# Patient Record
Sex: Female | Born: 1960 | Race: Black or African American | Hispanic: No | Marital: Single
Health system: Midwestern US, Community
[De-identification: ages and names within clinical notes are randomized; demographics above are authoritative.]

## PROBLEM LIST (undated history)

## (undated) ENCOUNTER — Emergency Department (HOSPITAL_COMMUNITY): Admission: EM | Payer: Self-pay | Source: Home / Self Care

## (undated) DIAGNOSIS — J45909 Unspecified asthma, uncomplicated: Secondary | ICD-10-CM

## (undated) DIAGNOSIS — Z915 Personal history of self-harm: Secondary | ICD-10-CM

## (undated) DIAGNOSIS — Z8673 Personal history of transient ischemic attack (TIA), and cerebral infarction without residual deficits: Secondary | ICD-10-CM

## (undated) DIAGNOSIS — I10 Essential (primary) hypertension: Secondary | ICD-10-CM

## (undated) DIAGNOSIS — Z9151 Personal history of suicidal behavior: Secondary | ICD-10-CM

## (undated) DIAGNOSIS — G8929 Other chronic pain: Secondary | ICD-10-CM

## (undated) DIAGNOSIS — I519 Heart disease, unspecified: Secondary | ICD-10-CM

## (undated) DIAGNOSIS — M25572 Pain in left ankle and joints of left foot: Secondary | ICD-10-CM

## (undated) DIAGNOSIS — E785 Hyperlipidemia, unspecified: Secondary | ICD-10-CM

## (undated) DIAGNOSIS — R011 Cardiac murmur, unspecified: Secondary | ICD-10-CM

## (undated) DIAGNOSIS — E119 Type 2 diabetes mellitus without complications: Secondary | ICD-10-CM

## (undated) DIAGNOSIS — J439 Emphysema, unspecified: Secondary | ICD-10-CM

## (undated) DIAGNOSIS — F17200 Nicotine dependence, unspecified, uncomplicated: Secondary | ICD-10-CM

## (undated) DIAGNOSIS — G4700 Insomnia, unspecified: Secondary | ICD-10-CM

## (undated) DIAGNOSIS — F419 Anxiety disorder, unspecified: Secondary | ICD-10-CM

## (undated) DIAGNOSIS — K219 Gastro-esophageal reflux disease without esophagitis: Secondary | ICD-10-CM

## (undated) DIAGNOSIS — IMO0001 Reserved for inherently not codable concepts without codable children: Secondary | ICD-10-CM

## (undated) DIAGNOSIS — E559 Vitamin D deficiency, unspecified: Secondary | ICD-10-CM

## (undated) DIAGNOSIS — B3731 Acute candidiasis of vulva and vagina: Secondary | ICD-10-CM

## (undated) DIAGNOSIS — D4989 Neoplasm of unspecified behavior of other specified sites: Secondary | ICD-10-CM

## (undated) HISTORY — DX: Personal history of transient ischemic attack (TIA), and cerebral infarction without residual deficits: Z86.73

## (undated) HISTORY — DX: Anxiety disorder, unspecified: F41.9

## (undated) HISTORY — DX: Nicotine dependence, unspecified, uncomplicated: F17.200

## (undated) HISTORY — DX: Other chronic pain: G89.29

## (undated) HISTORY — DX: Cardiac murmur, unspecified: R01.1

## (undated) HISTORY — DX: Hyperlipidemia, unspecified: E78.5

## (undated) HISTORY — DX: Pain in left ankle and joints of left foot: M25.572

## (undated) HISTORY — DX: Personal history of self-harm: Z91.5

## (undated) HISTORY — DX: Insomnia, unspecified: G47.00

## (undated) HISTORY — DX: Personal history of suicidal behavior: Z91.51

## (undated) HISTORY — DX: Heart disease, unspecified: I51.9

---

## 2015-01-24 ENCOUNTER — Telehealth: Payer: Self-pay | Admitting: Medical

## 2015-01-24 NOTE — Telephone Encounter (Signed)
Records received from previous providers. Sending back to chandra and then to [facility] for review.

## 2015-01-27 NOTE — Telephone Encounter (Signed)
Records was sorted and filled

## 2015-01-29 ENCOUNTER — Institutional Professional Consult (permissible substitution): Payer: Self-pay | Admitting: Medical

## 2015-01-31 ENCOUNTER — Encounter (HOSPITAL_COMMUNITY): Payer: Self-pay | Admitting: *Deleted

## 2015-01-31 ENCOUNTER — Emergency Department (HOSPITAL_COMMUNITY): Payer: 59

## 2015-01-31 ENCOUNTER — Telehealth (HOSPITAL_BASED_OUTPATIENT_CLINIC_OR_DEPARTMENT_OTHER): Payer: Self-pay | Admitting: Emergency Medicine

## 2015-01-31 ENCOUNTER — Emergency Department (HOSPITAL_COMMUNITY)
Admission: EM | Admit: 2015-01-31 | Discharge: 2015-01-31 | Disposition: A | Discharge status: Home / Self Care | Payer: 59 | Attending: Emergency Medicine | Admitting: Emergency Medicine

## 2015-01-31 DIAGNOSIS — I10 Essential (primary) hypertension: Secondary | ICD-10-CM | POA: Insufficient documentation

## 2015-01-31 DIAGNOSIS — Z79899 Other long term (current) drug therapy: Secondary | ICD-10-CM | POA: Insufficient documentation

## 2015-01-31 DIAGNOSIS — E119 Type 2 diabetes mellitus without complications: Secondary | ICD-10-CM | POA: Insufficient documentation

## 2015-01-31 DIAGNOSIS — L299 Pruritus, unspecified: Secondary | ICD-10-CM | POA: Diagnosis not present

## 2015-01-31 DIAGNOSIS — R51 Headache: Secondary | ICD-10-CM | POA: Diagnosis not present

## 2015-01-31 DIAGNOSIS — J441 Chronic obstructive pulmonary disease with (acute) exacerbation: Secondary | ICD-10-CM | POA: Diagnosis not present

## 2015-01-31 DIAGNOSIS — Z720 Tobacco use: Secondary | ICD-10-CM | POA: Insufficient documentation

## 2015-01-31 DIAGNOSIS — J040 Acute laryngitis: Secondary | ICD-10-CM | POA: Insufficient documentation

## 2015-01-31 DIAGNOSIS — R05 Cough: Secondary | ICD-10-CM | POA: Diagnosis present

## 2015-01-31 HISTORY — DX: Emphysema, unspecified: J43.9

## 2015-01-31 HISTORY — DX: Type 2 diabetes mellitus without complications: E11.9

## 2015-01-31 HISTORY — DX: Unspecified asthma, uncomplicated: J45.909

## 2015-01-31 HISTORY — DX: Essential (primary) hypertension: I10

## 2015-01-31 LAB — CBC
HCT: 45.2 % (ref 36.0–46.0)
Hemoglobin: 14.8 g/dL (ref 12.0–15.0)
MCH: 29.8 pg (ref 26.0–34.0)
MCHC: 32.7 g/dL (ref 30.0–36.0)
MCV: 91.1 fL (ref 78.0–100.0)
Platelets: 271 10*3/uL (ref 150–400)
RBC: 4.96 MIL/uL (ref 3.87–5.11)
RDW: 14.4 % (ref 11.5–15.5)
WBC: 4.2 10*3/uL (ref 4.0–10.5)

## 2015-01-31 LAB — BASIC METABOLIC PANEL
ANION GAP: 10 (ref 5–15)
BUN: 12 mg/dL (ref 6–20)
CHLORIDE: 103 mmol/L (ref 101–111)
CO2: 26 mmol/L (ref 22–32)
Calcium: 9 mg/dL (ref 8.9–10.3)
Creatinine, Ser: 0.8 mg/dL (ref 0.44–1.00)
GFR calc non Af Amer: >60 mL/min (ref >60)
Glucose, Bld: 187 mg/dL — ABNORMAL HIGH (ref 65–99)
POTASSIUM: 3.9 mmol/L (ref 3.5–5.1)
Sodium: 139 mmol/L (ref 135–145)

## 2015-01-31 LAB — I-STAT TROPONIN, ED: TROPONIN I, POC: 0.01 ng/mL (ref 0.00–0.08)

## 2015-01-31 LAB — CBG MONITORING, ED: GLUCOSE-CAPILLARY: 201 mg/dL — AB (ref 65–99)

## 2015-01-31 MED ORDER — HYDROXYZINE HCL 25 MG PO TABS
25.0000 mg | ORAL_TABLET | Freq: Four times a day (QID) | ORAL | Status: DC
Start: 2015-01-31 — End: 2015-02-20

## 2015-01-31 MED ORDER — PREDNISONE 20 MG PO TABS: ORAL_TABLET | ORAL | Status: DC

## 2015-01-31 NOTE — ED Notes (Signed)
Pt called out, when this RN went to check on pt, pt started yelling and cursing, stating "I've been waiting here for 3 hours and y'all haven't given me anything to stop this itching, you gonna watch me scratch myself to death, I can't breathe and y'all are letting me croak to death. Why are you looking at me,  Bring me something to stop this". Explained to pt that until EDP evaluates her and orders are placed, this RN can't do anything for her itching. Cool compresses offered pt refused. Pt's VSS and xray is unremarkable, pt was observed talking and laughing loudly entire time while in the room, prior to raging at this Clinical research associate.

## 2015-01-31 NOTE — ED Provider Notes (Signed)
CSN: [id number]     Arrival date & time 01/31/15  1209 History   First MD Initiated Contact with Patient 01/31/15 1420     Chief Complaint  Patient presents with  . Asthma  . Cough  . Headache     (Consider location/radiation/quality/duration/timing/severity/associated sxs/prior Treatment) HPI Patient states that she has had a cough for about a week. She is having some white mucus production occasionally. She was she coughs really hard at [facility] burned up in the center of her chest and her throat. She has not had any fever. Associated pleuritic quality chest pain. She reports also her voice has been hoarse for the past week. The patient does continue to smoke and has a diagnosis of COPD. She also states that she has itching that has been going on for a number of years. Her doctor in the past of given her hydroxyzine which is helpful but she has run out of it now due to moving. She reports that she occasionally sees a little bump but mostly sure there is nothing there and she just scratches until her skin gets inflamed and may be even scratch is a little mark on it. He like me to check and make sure she doesn't have any lice. She states that she does not go outside and doesn't think she could've had any insect bites or allergic reaction to any outdoor components. She does however note that the rash seems to get worse and she itches more when she's been on the sun. Past Medical History  Diagnosis Date  . Emphysema/COPD   . Asthma   . Hypertension   . Diabetes mellitus without complication    History reviewed. No pertinent past surgical history. History reviewed. No pertinent family history. History  Substance Use Topics  . Smoking status: Current Every Day Smoker -- 1.00 packs/day for 40 years    Types: Cigarettes  . Smokeless tobacco: Not on file  . Alcohol Use: No   OB History    No data available     Review of Systems 10 Systems reviewed and are negative for acute change except as  noted in the HPI.    Allergies  Review of patient's allergies indicates no known allergies.  Home Medications   Prior to Admission medications   Medication Sig Start Date End Date Taking? Authorizing Provider  ALPRAZolam (XANAX XR) 2 MG 24 hr tablet Take 2 mg by mouth at bedtime as needed (for sleep).   Yes Historical Provider, MD  cloNIDine (CATAPRES) 0.2 MG tablet Take 0.2 mg by mouth 2 (two) times daily.   Yes Historical Provider, MD  HYDROcodone-acetaminophen (NORCO) 7.5-325 MG per tablet Take 1 tablet by mouth every 4 (four) hours as needed for moderate pain.   Yes Historical Provider, MD  hydrOXYzine (ATARAX/VISTARIL) 25 MG tablet Take 25 mg by mouth every 6 (six) hours as needed for itching.   Yes Historical Provider, MD  lisinopril (PRINIVIL,ZESTRIL) 20 MG tablet Take 20 mg by mouth daily.   Yes Historical Provider, MD  lisinopril-hydrochlorothiazide (PRINZIDE,ZESTORETIC) 10-12.5 MG per tablet Take 1 tablet by mouth daily.   Yes Historical Provider, MD  metFORMIN (GLUCOPHAGE) 850 MG tablet Take 850 mg by mouth 2 (two) times daily with a meal.   Yes Historical Provider, MD  hydrOXYzine (ATARAX/VISTARIL) 25 MG tablet Take 1 tablet (25 mg total) by mouth every 6 (six) hours. 01/31/15   Arby Barrette, MD  predniSONE (DELTASONE) 20 MG tablet 3 tabs po day one, then 2 tabs  daily x 4 days 01/31/15   Arby Barrette, MD   BP 178/111 mmHg  Pulse 83  Temp(Src) 97.9 F (36.6 C) (Oral)  Resp 18  SpO2 96% Physical Exam  Constitutional: She is oriented to person, place, and time. She appears well-developed and well-nourished.  HENT:  Head: Normocephalic and atraumatic.  Patient's voice is slightly hoarse. Posterior oropharynx is widely patent with no erythema or exudate. Uvula is midline. Neck is supple and there is no stridor.  Eyes: EOM are normal. Pupils are equal, round, and reactive to light.  Neck: Neck supple.  Cardiovascular: Normal rate, regular rhythm, normal heart sounds and  intact distal pulses.   Pulmonary/Chest: Effort normal. She has wheezes.  Occasional fine expiratory wheeze. Good air flow with no respiratory distress.  Abdominal: Soft. Bowel sounds are normal. She exhibits no distension. There is no tenderness.  Musculoskeletal: Normal range of motion. She exhibits no edema.  Neurological: She is alert and oriented to person, place, and time. She has normal strength. Coordination normal. GCS eye subscore is 4. GCS verbal subscore is 5. GCS motor subscore is 6.  Skin: Skin is warm, dry and intact.  Skin condition is excellent. Close examination of the hair around the ears and the nape shows it to be clean with no evidence of nits or dermatitis of any type. Remainder of her skin has occasional area that she has scratched and excoriated very mildly. Her general skin condition however is very good with no excessive dryness, scaling or irregular texture.  Psychiatric: She has a normal mood and affect.    ED Course  Procedures (including critical care time) Labs Review Labs Reviewed  BASIC METABOLIC PANEL - Abnormal; Notable for the following:    Glucose, Bld 187 (*)    All other components within normal limits  CBG MONITORING, ED - Abnormal; Notable for the following:    Glucose-Capillary 201 (*)    All other components within normal limits  CBC  I-STAT TROPOININ, ED    Imaging Review Dg Chest 2 View (if Patient Has Fever And/or Copd)  01/31/2015   CLINICAL DATA:  Cough for 8 days.  Smoker.  Emphysema.  EXAM: CHEST  2 VIEW  COMPARISON:  None.  FINDINGS: Low lung volumes. The heart size and mediastinal contours are within normal limits. Both lungs are clear. The visualized skeletal structures are unremarkable.  IMPRESSION: No active cardiopulmonary disease.   Electronically Signed   By: Elsie Stain M.D.   On: 01/31/2015 13:22     EKG Interpretation None      MDM   Final diagnoses:  COPD exacerbation  Laryngitis  Pruritus   Patient identifies  himself as being between physicians due to having moved recently. She appears to have a mild acute exacerbation of her COPD. There are mild findings of laryngitis. The pruritus is chronic with unknown etiology. She identifies that symptom ever since a blood transfusion over 10 years ago. She reports she was told that she had gotten transfused with some "antibodies" and now she thinks this is the cause.    Arby Barrette, MD 01/31/15 [phone]

## 2015-01-31 NOTE — ED Notes (Addendum)
Pt reports hx of COPD/asthma. 40 year cigarette smoker. Reports productive cough x2 weeks. Reports throat pain and headache 9/10. Slight expiratory wheezing heard. Using inhaler with no relief.   Pt reports she has had 3 blood transfusions in past and last transfusion caused her to have rash of some type.  Pt originally from [place], just got a primary doctor, first appointment July 7. Pt requesting prescription for Brio medication.

## 2015-01-31 NOTE — Discharge Instructions (Signed)
Chronic Asthmatic Bronchitis Chronic asthmatic bronchitis is a complication of persistent asthma. After a period of time with asthma, some people develop airflow obstruction that is present all the time, even when not having an asthma attack.There is also persistent inflammation of the airways, and the bronchial tubes produce more mucus. Chronic asthmatic bronchitis usually is a permanent problem with the lungs. CAUSES  Chronic asthmatic bronchitis happens most often in people who have asthma and also smoke cigarettes. Occasionally, it can happen to a person with long-standing or severe asthma even if the person is not a smoker. SIGNS AND SYMPTOMS  Chronic asthmatic bronchitis usually causes symptoms of both asthma and chronic bronchitis, including:   Coughing.  Increased sputum production.  Wheezing and shortness of breath.  Chest discomfort.  Recurring infections. DIAGNOSIS  Your health care provider will take a medical history and perform a physical exam. Chronic asthmatic bronchitis is suspected when a person with asthma has abnormal results on breathing tests (pulmonary function tests) even when breathing symptoms are at their best. Other tests, such as a chest X-ray, may be performed to rule out other conditions.  TREATMENT  Treatment involves controlling symptoms with medicine and lifestyle changes.  Your health care provider may prescribe asthma medicines, including inhaler and nebulizer medicines.  Infection can be treated with medicine to kill germs (antibiotics). Serious infections may require hospitalization. These can include:  Pneumonia.  Sinus infections.  Acute bronchitis.   Preventing infection and hospitalization is very important. Get an influenza vaccination every year as directed by your health care provider. Ask your health care provider whether you need a pneumonia vaccine.  Ask your health care provider whether you would benefit from a pulmonary  rehabilitation program. HOME CARE INSTRUCTIONS  Take medicines only as directed by your health care provider.  If you are a cigarette smoker, the most important thing that you can do is quit. Talk to your health care provider for help with quitting smoking.  Avoid pollen, dust, animal dander, molds, smoke, and other things that cause attacks.  Regular exercise is very important to help you feel better. Discuss possible exercise routines with your health care provider.  If animal dander is the cause of asthma, you may not be able to keep pets.  It is important that you:  Become educated about your medical condition.  Participate in maintaining wellness.  Seek medical care as directed. Delay in seeking medical care could cause permanent injury and may be a risk to your life. SEEK MEDICAL CARE IF:  You have wheezing and shortness of breath even if taking medicine to prevent attacks.  You have muscle aches, chest pain, or thickening of sputum.  Your sputum changes from clear or white to yellow, green, gray, or bloody. SEEK IMMEDIATE MEDICAL CARE IF:  Your usual medicines do not stop your wheezing.  You have increased coughing or shortness of breath or both.  You have increased difficulty breathing.  You have any problems from the medicine you are taking, such as a rash, itching, swelling, or trouble breathing. MAKE SURE YOU:   Understand these instructions.  Will watch your condition.  Will get help right away if you are not doing well or get worse. Document Released: 05/20/2006 Document Revised: 12/17/2013 Document Reviewed: 09/10/2013 [facility] Patient Information 2015 [place], [place]. This information is not intended to replace advice given to you by your health care provider. Make sure you discuss any questions you have with your health care provider. Laryngitis At the top  of your windpipe is your voice box. It is the source of your voice. Inside your voice box are 2  bands of muscles called vocal cords. When you breathe, your vocal cords are relaxed and open so that air can get into the lungs. When you decide to say something, these cords come together and vibrate. The sound from these vibrations goes into your throat and comes out through your mouth as sound. Laryngitis is an inflammation of the vocal cords that causes hoarseness, cough, loss of voice, sore throat, and dry throat. Laryngitis can be temporary (acute) or long-term (chronic). Most cases of acute laryngitis improve with time.Chronic laryngitis lasts for more than 3 weeks. CAUSES Laryngitis can often be related to excessive smoking, talking, or yelling, as well as inhalation of toxic fumes and allergies. Acute laryngitis is usually caused by a viral infection, vocal strain, measles or mumps, or bacterial infections. Chronic laryngitis is usually caused by vocal cord strain, vocal cord injury, postnasal drip, growths on the vocal cords, or acid reflux. SYMPTOMS   Cough.  Sore throat.  Dry throat. RISK FACTORS  Respiratory infections.  Exposure to irritating substances, such as cigarette smoke, excessive amounts of alcohol, stomach acids, and workplace chemicals.  Voice trauma, such as vocal cord injury from shouting or speaking too loud. DIAGNOSIS  Your cargiver will perform a physical exam. During the physical exam, your caregiver will examine your throat. The most common sign of laryngitis is hoarseness. Laryngoscopy may be necessary to confirm the diagnosis of this condition. This procedure allows your caregiver to look into the larynx. HOME CARE INSTRUCTIONS  Drink enough fluids to keep your urine clear or pale yellow.  Rest until you no longer have symptoms or as directed by your caregiver.  Breathe in moist air.  Take all medicine as directed by your caregiver.  Do not smoke.  Talk as little as possible (this includes whispering).  Write on paper instead of talking until your  voice is back to normal.  Follow up with your caregiver if your condition has not improved after 10 days. SEEK MEDICAL CARE IF:   You have trouble breathing.  You cough up blood.  You have persistent fever.  You have increasing pain.  You have difficulty swallowing. MAKE SURE YOU:  Understand these instructions.  Will watch your condition.  Will get help right away if you are not doing well or get worse. Document Released: 08/02/2005 Document Revised: 10/25/2011 Document Reviewed: 10/08/2010 [facility] Patient Information 2015 [place], [place]. This information is not intended to replace advice given to you by your health care provider. Make sure you discuss any questions you have with your health care provider. Pruritus  Pruritus is an itch. There are many different problems that can cause an itch. Dry skin is one of the most common causes of itching. Most cases of itching do not require medical attention.  HOME CARE INSTRUCTIONS  Make sure your skin is moistened on a regular basis. A moisturizer that contains petroleum jelly is best for keeping moisture in your skin. If you develop a rash, you may try the following for relief:   Use corticosteroid cream.  Apply cool compresses to the affected areas.  Bathe with Epsom salts or baking soda in the bathwater.  Soak in colloidal oatmeal baths. These are available at your pharmacy.  Apply baking soda paste to the rash. Stir water into baking soda until it reaches a paste-like consistency.  Use an anti-itch lotion.  Take over-the-counter diphenhydramine  medicine by mouth as the instructions direct.  Avoid scratching. Scratching may cause the rash to become infected. If itching is very bad, your caregiver may suggest prescription lotions or creams to lessen your symptoms.  Avoid hot showers, which can make itching worse. A cold shower may help with itching as long as you use a moisturizer after the shower. SEEK MEDICAL CARE  IF: The itching does not go away after several days. Document Released: 04/14/2011 Document Revised: 12/17/2013 Document Reviewed: 04/14/2011 [facility] Patient Information 2015 [place], [place]. This information is not intended to replace advice given to you by your health care provider. Make sure you discuss any questions you have with your health care provider.  Emergency Department Resource Guide 1) Find a Doctor and Pay Out of Pocket Although you won't have to find out who is covered by your insurance plan, it is a good idea to ask around and get recommendations. You will then need to call the office and see if the doctor you have chosen will accept you as a new patient and what types of options they offer for patients who are self-pay. Some doctors offer discounts or will set up payment plans for their patients who do not have insurance, but you will need to ask so you aren't surprised when you get to your appointment.  2) Contact Your Local Health Department Not all health departments have doctors that can see patients for sick visits, but many do, so it is worth a call to see if yours does. If you don't know where your local health department is, you can check in your phone book. The CDC also has a tool to help you locate your state's health department, and many state websites also have listings of all of their local health departments.  3) Find a Walk-in Clinic If your illness is not likely to be very severe or complicated, you may want to try a walk in clinic. These are popping up all over the country in pharmacies, drugstores, and shopping centers. They're usually staffed by nurse practitioners or physician assistants that have been trained to treat common illnesses and complaints. They're usually fairly quick and inexpensive. However, if you have serious medical issues or chronic medical problems, these are probably not your best option.  No Primary Care Doctor: - Call Health Connect at   [phone] - they can help you locate a primary care doctor that  accepts your insurance, provides certain services, etc. - Physician Referral Service- [phone]  Chronic Pain Problems: Organization         Address  Phone   Notes  [facility]  [phone] Patients need to be referred by their primary care doctor.   Medication Assistance: Organization         Address  Phone   Notes  [facility] Medication [facility] [address] [place] [place], [place] [address] [phone] --Must be a resident of [facility] -- Must have NO insurance coverage whatsoever (no Medicaid/ Medicare, etc.) -- The pt. MUST have a primary care doctor that directs their care regularly and follows them in the community   [facility]  [phone]   [employer]  [phone]    Agencies that provide inexpensive medical care: Organization         Address  Phone   Notes  [facility]  [phone]   [facility]    [phone]   [facility] Outpatient  Clinic [address] [place], [place] [address] [phone]   [facility] [address] [place], [place] [phone]   Planned Parenthood    [phone]   [facility]    [phone]   [facility]  [address], [place] Phone:  [phone], Fax:  [phone] Hours of Operation:  9 am - 6 pm, M-F.  Also accepts Medicaid/Medicare and self-pay.  [facility]  [address], [place] Phone: [phone], Fax: [phone]. Hours of Operation:  8:30 am - 5:30 pm, M-F.  Also accepts Medicaid and self-pay.  [facility] [place] [address], [place] Phone: [phone]   [facility] [address] Natasha Bence [place], [place] [phone], Ext. [phone] Mondays & Thursdays: 7-9 AM.  First 15 patients are seen on a first come, first serve basis.     Medicaid-accepting [facility] Providers:  Organization         Address  Phone   Notes  [facility] [address], [place] [phone] Also accepts self-pay patients.  [facility] [address] Laurell Josephs [place], [place]  [phone]   [facility] [address], [place] [phone]   [facility] [address] [place] [phone]   Renaye Rakers [address], [place]   [phone] Only accepts [place] Access [place] patients after they have their name applied to their card.   Self-Pay (no insurance) in [facility]:  Organization         Address  Phone   Notes  Sickle Cell Patients, [facility] Internal Medicine [address] [place], [place] [phone]   [facility] Urgent Care [address] [place], [place] [phone]   [facility]  [address], [place] [phone]   [facility]/Dr. Osei-Bonsu  [address] [place] or [address], [place] [phone] Phone number for both [place] and [place] locations is the same.  [facility] [address], [place] [phone]   [facility] [address], [place] or [address] [phone]   [facility] [address] [place] [phone], phone; [phone], fax Sees patients 1st and 3rd Saturday of every month.  Must not qualify for public or private insurance (i.e. Medicaid, Medicare, [place] Health Choice, Veterans' Benefits)  Household income should be no more than 200% of the poverty level The clinic cannot treat you if you are pregnant or think you are pregnant  Sexually transmitted diseases are not treated at the clinic.    Dental Care: Organization         Address  Phone  Notes  [facility]  Department of [facility] [address] [place], [place] [phone] Accepts children up to age 53 who are enrolled in [place] or [place] Health Choice; pregnant women with a Medicaid card; and children who have applied for Medicaid or [place] Health Choice, but were declined, whose parents can pay a reduced fee at time of service.  [facility] Department of [facility]  [address], [place] [phone] Accepts children up to age 4 who are enrolled in  Medicaid or [place] Health Choice; pregnant women with a Medicaid card; and children who have applied for Medicaid or [place] Health Choice, but were declined, whose parents can pay a reduced fee at time of service.  [facility]  [address] [place], [place] [phone] Patients are seen by appointment only. Walk-ins are not accepted. [facility] will see patients 57 years of age and older. Monday - Tuesday (8am-5pm) Most Wednesdays (8:30-5pm) $30 per visit, cash only  [facility] Adult Dental Access PROGRAM  [address], [facility] [phone] Patients are seen by appointment only. Walk-ins are not accepted. [facility] will see patients 88 years of age and older. One Wednesday Evening (Monthly: Volunteer Based).  $30 per visit, cash only  [employer]  [phone] for adults; Children under age 47, call Graduate Pediatric Dentistry at [phone]. Children aged 64-14, please call [phone] to request a pediatric application.  Dental services are provided in all areas of dental care including fillings, crowns and bridges, complete and partial dentures, implants, gum treatment, root canals, and extractions. Preventive care is also provided. Treatment is provided to both adults and children. Patients are selected via a lottery and there is often a waiting list.   [facility] [address] [place]  [phone]  [email or web address]   [facility] [address] [place], [place] [phone], Ext. [phone] Second and Fourth Thursday of each month, opens at 6:30 AM; Clinic ends at 9 AM.  Patients are seen on a first-come first-served basis, and a limited number are seen during each clinic.   [facility]  [address] Ether Griffins [place], [place] [phone]   Eligibility Requirements You must have lived in [place], [place], or [place] counties for at least the last three months.   You cannot be eligible for state or federal sponsored [place], including [employer], [place], or [employer].   You generally cannot be eligible for healthcare insurance through your employer.    How to apply: Eligibility screenings are held every Tuesday and Wednesday afternoon from 1:00 pm until 4:00 pm. You do not need an appointment for the interview!  [facility] [address], [place], [place] [phone]   [facility] Health Department  [phone]   [facility] Health Department  [phone]   [facility] Health Department  [phone]    Behavioral Health Resources in the Community: Intensive Outpatient Programs Organization         Address  Phone  Notes  [facility] [address] [place], [place] [phone]   [facility] Outpatient [address], [place], [place] [phone]   [facility] [address] [place], [place]  [phone]   [facility] [address]  [place], [place] [phone] or [phone]   Substance Abuse Resources Organization         Address  Phone  Notes  [facility]  [phone]   [facility]  [phone]   [facility]  ([phone]   Floydene Flock  [phone]   Residential & Outpatient Substance Abuse Program  [phone]   Psychological Services Organization          Address  Phone  Notes  [facility]al Health  [phone]   [facility]  [phone]   [facility] Mental Health [address] [place] [phone] or  [phone]    Mobile Crisis Teams Organization         Address  Phone  Notes  [facility], Mobile Crisis Care Unit  [phone]   [facility]  [address] [place], [place] [phone]   [facility] [address] [place] [phone]    Self-Help/Support Groups Organization         Address  Phone             Notes  [facility] [place] - variety of support groups  336- I7437963 Call for more information  Narcotics Anonymous (NA), [facility] [address], [employer] [place]  2 meetings at this location   Statistician         Address  Phone  Notes  [facility] [address],    [place]  [phone]   [facility]  [address], [place] [address], [place], [place] [phone]   [facility] [address] [place], [place] [phone] Admissions: 8am-3pm M-F  [facility] [address],    [place], [place] [phone]   [facility] [address] [place], [place], [place] [phone]   [facility] [address]  [place], [place] [phone]   [facility] - Intensive Outpatient [address] Laurell Josephs [address], [place], [place] [phone]   [facility] ([facility] [address] [place]  [place], [place] [phone] or [phone]   [facility] ([facility]) [address] [place], [place] [phone] Accepts Medicaid  [facility] [address],  [place] [phone] Substance Abuse/Addiction Treatment   [facility] Organization         Address  Phone  Notes  [facility]  [phone]   Angie Fava, PhD [address] Ervin Knack [place], [place]   [phone] or [phone]   [facility]al   [address] [place], [place] [phone]   [facility] [address] [place], [place] [phone] Insurance/Medicaid/sponsorship through [facility] and Families [address]                                    [place], [place] [phone] Therapy/tele-psych/case  [facility] [address], [place] [phone]    Dr. Lolly Mustache  [phone]   [facility]  [employer] [facility] Dept. 1) [address] [place] 2) [address] [place] 3)  [address], [place] [phone]  [phone]   [facility] Child Abuse Hotline 367-639-8316 or [phone] (After Hours)

## 2015-01-31 NOTE — ED Notes (Signed)
Pt observed talking, laughing, in NAD. Family at bedside, VSS.

## 2015-02-20 ENCOUNTER — Ambulatory Visit (INDEPENDENT_AMBULATORY_CARE_PROVIDER_SITE_OTHER): Payer: 59 | Admitting: Medical

## 2015-02-20 ENCOUNTER — Encounter: Payer: Self-pay | Admitting: Medical

## 2015-02-20 VITALS — BP 156/98 | HR 80 | Temp 98.0°F | Resp 16 | Ht 63.0 in | Wt 201.0 lb

## 2015-02-20 DIAGNOSIS — F411 Generalized anxiety disorder: Secondary | ICD-10-CM | POA: Diagnosis not present

## 2015-02-20 DIAGNOSIS — E1165 Type 2 diabetes mellitus with hyperglycemia: Secondary | ICD-10-CM

## 2015-02-20 DIAGNOSIS — I519 Heart disease, unspecified: Secondary | ICD-10-CM | POA: Diagnosis not present

## 2015-02-20 DIAGNOSIS — Z8673 Personal history of transient ischemic attack (TIA), and cerebral infarction without residual deficits: Secondary | ICD-10-CM | POA: Diagnosis not present

## 2015-02-20 DIAGNOSIS — G8929 Other chronic pain: Secondary | ICD-10-CM | POA: Insufficient documentation

## 2015-02-20 DIAGNOSIS — F17200 Nicotine dependence, unspecified, uncomplicated: Secondary | ICD-10-CM | POA: Diagnosis not present

## 2015-02-20 DIAGNOSIS — I10 Essential (primary) hypertension: Secondary | ICD-10-CM | POA: Diagnosis not present

## 2015-02-20 DIAGNOSIS — E785 Hyperlipidemia, unspecified: Secondary | ICD-10-CM | POA: Diagnosis not present

## 2015-02-20 DIAGNOSIS — T889XXA Complication of surgical and medical care, unspecified, initial encounter: Secondary | ICD-10-CM

## 2015-02-20 DIAGNOSIS — R197 Diarrhea, unspecified: Secondary | ICD-10-CM

## 2015-02-20 DIAGNOSIS — IMO0002 Reserved for concepts with insufficient information to code with codable children: Secondary | ICD-10-CM

## 2015-02-20 DIAGNOSIS — M25572 Pain in left ankle and joints of left foot: Secondary | ICD-10-CM | POA: Diagnosis not present

## 2015-02-20 DIAGNOSIS — M25579 Pain in unspecified ankle and joints of unspecified foot: Secondary | ICD-10-CM

## 2015-02-20 LAB — POCT GLYCOSYLATED HEMOGLOBIN (HGB A1C): HEMOGLOBIN A1C: 10.4

## 2015-02-20 MED ORDER — ALPRAZOLAM 0.5 MG PO TABS: 0.5000 mg | ORAL_TABLET | Freq: Every evening | ORAL | Status: DC | PRN

## 2015-02-20 MED ORDER — FLUTICASONE FUROATE-VILANTEROL 200-25 MCG/INH IN AEPB: 1.0000 | INHALATION_SPRAY | Freq: Every day | RESPIRATORY_TRACT | Status: DC

## 2015-02-20 MED ORDER — SAXAGLIPTIN-METFORMIN ER 5-1000 MG PO TB24: 1.0000 | ORAL_TABLET | Freq: Every day | ORAL | Status: DC

## 2015-02-20 MED ORDER — ALBUTEROL SULFATE HFA 108 (90 BASE) MCG/ACT IN AERS: 2.0000 | INHALATION_SPRAY | Freq: Four times a day (QID) | RESPIRATORY_TRACT | Status: DC | PRN

## 2015-02-20 MED ORDER — TRAMADOL HCL 50 MG PO TABS: 50.0000 mg | ORAL_TABLET | Freq: Two times a day (BID) | ORAL | Status: DC | PRN

## 2015-02-20 MED ORDER — AMLODIPINE BESYLATE 5 MG PO TABS: 5.0000 mg | ORAL_TABLET | Freq: Every day | ORAL | Status: DC

## 2015-02-20 MED ORDER — LISINOPRIL-HYDROCHLOROTHIAZIDE 20-12.5 MG PO TABS: 1.0000 | ORAL_TABLET | Freq: Every day | ORAL | Status: DC

## 2015-02-20 MED ORDER — BUPROPION HCL ER (XL) 150 MG PO TB24: 150.0000 mg | ORAL_TABLET | Freq: Every day | ORAL | Status: DC

## 2015-02-20 MED ORDER — CLONIDINE HCL 0.1 MG PO TABS: 0.1000 mg | ORAL_TABLET | Freq: Every day | ORAL | Status: DC

## 2015-02-20 NOTE — Progress Notes (Signed)
Subjective: Here as a new patient today.   Relocated to [place] back in 07/2014.  Moved from [place].   Was seeing 1 doctor back in [place].    Her medical problems include diabetes, hypertension, hyperlipidemia, hx/o 7 TIAs, COPD, hx/o MI and heart disease, heavy smoker 2ppd, anxiety, hx/o suicide attempts in the past.    Lives alone.  Has numerous health issues.   Works at [employer], has to be focused and diarrhea is problems with loose stool having to run to the bathroom.   The clonidine for BP makes her sleepy.  Has chronic foot pains.   Has anxiety.   Needs refills on medications.  Diabetes - last Hgba1C unsure.  Checks sugars, gets 200s regularly.  Takes Metformin 850mg  BID. Can't tolerate this due to diarrhea.     HTN - takes Lisinopril HCT 10/12.5mg  daily.   Had been taking Clonidine 0.2mg  BID but stopped this few months ago, and separate Lisinopril 20mg  daily.    Hx/o heart disease, had catheterization 2008.  No stents, no CABG.   Thinks she had MI but not sure.  Had 7 TIAs in 2006.  Not currently on statin or aspirin  Can't stop smoking on her own. Both parents worked for [employer].  Smokes 2ppd.   Uses Breo inhaler for COPD.    Has hx/o anxiety, was on prozac but didn't like the way it made her feel. Was taking XR Xanax daily, been out of this.   Has hx/o chronic ankle pain from prior fracture left ankle.   Foot hurts on regular basis.   Has been on long term narcotic pain medication.     Objective: BP 156/98 mmHg  Pulse 80  Temp(Src) 98 F (36.7 C) (Oral)  Resp 16  Ht 5\' 3"  (1.6 m)  Wt 201 lb (91.173 kg)  BMI 35.61 kg/m2  General appearance: alert, no distress, WD/WN, AA female somewhat raspy voice Oral cavity: MMM, no lesions Neck: supple, no lymphadenopathy, no thyromegaly, no masses Heart: 2/6 systolic brief murmur heard best in left upper sternal border, otherwise RRR, normal S1, S2, no murmurs Lungs: CTA bilaterally, no wheezes, rhonchi, or  rales Surgical scars of medial and lateral left ankle, tender throughout left ankle, no swelling Extremities: no edema, no cyanosis, no clubbing Pulses: 1+ symmetric, upper and lower extremities, normal cap refill Psychiatric: normal affect, behavior normal, pleasant     Assessment: Encounter Diagnoses  Name Primary?  . Diabetes mellitus type 2, uncontrolled Yes  . History of TIA (transient ischemic attack)   . Heavy smoker   . Hyperlipidemia   . Heart disease   . Essential hypertension   . Side effects of treatment, initial encounter   . Diarrhea   . Generalized anxiety disorder   . Chronic ankle pain, left     Plan: I reviewed and discussed her numerous health issues, complicated by prior use of narcotics and xanax which are controled substances.   We discussed her heavy smoking, her uncontrolled diabetes.  To come up with an initial plan, gave several recommendations with medication changes today.  I reviewed the limited records available.    Patient Instructions  Recommendations:  Tobacco use , history of stroke, heart disease  You absolutely have to STOP smoking!!!  Begin a daily 81mg  baby aspirin at bedtime   Anxiety  Begin Wellbutrin 150mg  XL once daily in the evening for anxiety and tobacco cessation  Use Xanax as needed for panic attack or worse anxiety  Diabetes:  STOP plain metformin  Begin samples of Kombiglyze once daily in the mornign for diabetes to see if you tolerate this better   Hypertension  STOP Clonidine 0.2mg , STOP Lisinopril 10/12.5mg  daily, STOP Lisinopril  daily  Change to/begin Lisinopril 20/12.5mg  daily in the morning  Begin Norvasc  daily in the morning  Begin Clonidine 0.1mg  daily at bedtime for sleep and blood pressure   Insomnia   Begin Clonidine 0.1mg  daily at bedtime for sleep and blood pressure   Chronic ankle pain   Begin Ultram as needed for worse pain, 1-2 times daily   COPD  Continue Breo inhaler 1  inhalation once daily, rinse out mouth with water after use  Use albuterol inhaler as needed for shortness of breath     F/u 56mo with glucose readings.

## 2015-02-20 NOTE — Addendum Note (Signed)
Addended by: Lilli LightLOMAX, WENDY G on: 02/20/2015 11:41 AM   Modules accepted: Orders

## 2015-02-20 NOTE — Patient Instructions (Addendum)
Recommendations:  Tobacco use , history of stroke, heart disease  You absolutely have to STOP smoking!!!  Begin a daily  baby aspirin at bedtime   Anxiety  Begin Wellbutrin  XL once daily in the evening for anxiety and tobacco cessation  Use Xanax as needed for panic attack or worse anxiety   Diabetes:  STOP plain metformin  Begin samples of Kombiglyze once daily in the mornign for diabetes to see if you tolerate this better   Hypertension  STOP Clonidine 0.2mg , STOP Lisinopril 10/12.5mg  daily, STOP Lisinopril  daily  Change to/begin Lisinopril 20/12.5mg  daily in the morning  Begin Norvasc  daily in the morning  Begin Clonidine 0.1mg  daily at bedtime for sleep and blood pressure   Insomnia   Begin Clonidine 0.1mg  daily at bedtime for sleep and blood pressure   Chronic ankle pain   Begin Ultram as needed for worse pain, 1-2 times daily   COPD  Continue Breo inhaler 1 inhalation once daily, rinse out mouth with water after use  Use albuterol inhaler as needed for shortness of breath

## 2015-02-26 ENCOUNTER — Telehealth: Payer: Self-pay | Admitting: Family Medicine

## 2015-02-26 ENCOUNTER — Telehealth: Payer: Self-pay | Admitting: Medical

## 2015-02-26 NOTE — Telephone Encounter (Signed)
I just saw her as a new patient.  We JUST changed her to a medication other than metformin because of the bathroom issue.   The new medication shouldn't be giving her the same problem but she hasn't even been on it 2 wk.   Also we started her on Wellbutrin which is an antidepressant, and we didn't discuss other antidepressant.     I recommend getting her back in office visit to discuss.

## 2015-02-26 NOTE — Telephone Encounter (Signed)
Patient is coming into the office for an appointment on 02/27/15

## 2015-02-26 NOTE — Telephone Encounter (Signed)
Patient called the office yesterday and states that the DM medication that you prescribed her with the metformin in it is cause her to have to go to the bathroom several times while she is at work. She states that she got wrote up at work for going to the bathroom to many times.

## 2015-02-26 NOTE — Telephone Encounter (Signed)
Patient wanted to know if you would refill her Prozac? Please, advise on this

## 2015-02-26 NOTE — Telephone Encounter (Signed)
Patient called about new Rx you gave has metformin in it and states that is what is causing her bathroom issues. And she got in trouble at work for being in bathroom. Read her your message and she states that she cant afford to come in here and that we are not giving her medicines and she needs something without metformin in it. Also states that we gave her wrong strength of xanax and she is having to take more of them. Patient stated she did not want to schedule appointment. Before I finished phone message,s he called back and scheduled appointment for tomorrow. She also stated she might need to find another doctor because we won't give her all her medicines

## 2015-02-27 ENCOUNTER — Ambulatory Visit (INDEPENDENT_AMBULATORY_CARE_PROVIDER_SITE_OTHER): Payer: 59 | Admitting: Medical

## 2015-02-27 ENCOUNTER — Encounter: Payer: Self-pay | Admitting: Medical

## 2015-02-27 VITALS — BP 120/90 | HR 80 | Resp 15 | Wt 199.0 lb

## 2015-02-27 DIAGNOSIS — Z8673 Personal history of transient ischemic attack (TIA), and cerebral infarction without residual deficits: Secondary | ICD-10-CM

## 2015-02-27 DIAGNOSIS — F411 Generalized anxiety disorder: Secondary | ICD-10-CM

## 2015-02-27 DIAGNOSIS — J449 Chronic obstructive pulmonary disease, unspecified: Secondary | ICD-10-CM

## 2015-02-27 DIAGNOSIS — I10 Essential (primary) hypertension: Secondary | ICD-10-CM

## 2015-02-27 DIAGNOSIS — I519 Heart disease, unspecified: Secondary | ICD-10-CM

## 2015-02-27 DIAGNOSIS — R197 Diarrhea, unspecified: Secondary | ICD-10-CM

## 2015-02-27 DIAGNOSIS — IMO0002 Reserved for concepts with insufficient information to code with codable children: Secondary | ICD-10-CM

## 2015-02-27 DIAGNOSIS — E785 Hyperlipidemia, unspecified: Secondary | ICD-10-CM

## 2015-02-27 DIAGNOSIS — F17200 Nicotine dependence, unspecified, uncomplicated: Secondary | ICD-10-CM

## 2015-02-27 DIAGNOSIS — G8929 Other chronic pain: Secondary | ICD-10-CM | POA: Diagnosis not present

## 2015-02-27 DIAGNOSIS — T889XXD Complication of surgical and medical care, unspecified, subsequent encounter: Secondary | ICD-10-CM

## 2015-02-27 DIAGNOSIS — M25572 Pain in left ankle and joints of left foot: Secondary | ICD-10-CM

## 2015-02-27 DIAGNOSIS — E1165 Type 2 diabetes mellitus with hyperglycemia: Secondary | ICD-10-CM

## 2015-02-27 MED ORDER — EMPAGLIFLOZIN-LINAGLIPTIN 25-5 MG PO TABS: 1.0000 | ORAL_TABLET | Freq: Every day | ORAL | Status: DC

## 2015-02-27 NOTE — Patient Instructions (Signed)
RESOURCES in [place], [place]  If you are experiencing a mental health crisis or an emergency, please call 911 or go to the nearest emergency department.  [facility]  [phone]  Suicide Hotline 1-800-Suicide (401)765-3094(1-705-706-2855)  National Suicide Prevention Lifeline 1-800-273-TALK  838-702-6044(1-681-007-4919)      For psychiatry:  Call and make appointment with one of the following offices for psychiatrist for medication refills such as Xanax.  Dr. Milagros Evenerupinder Kaur, psychiatry [phone] office [address], [place], [place] [address]   Dr. Andee PolesParish McKinney, psychiatry [phone] office [email or web address] [address], [address], [place], [place] [address] Dr. Andee PolesParish McKinney Valinda HoarMeredith Baker, NP Grayland OrmondMicheal Lefaive, NP  Anxiety, Depression, ADHD, OCD, Eating Disorders, Bipolar, other   [facility] [phone] office [email or web address] [address] [place], [place] [address]     Counseling: We will refer you to [facility] for cousneling and anxiety

## 2015-02-27 NOTE — Telephone Encounter (Signed)
I thinks she should keep her appt today as I think there was miscommunication.  She has a lot of medical issues, and I addressed the diarrhea issue and medication side effects last visit, but maybe it got lost in all the details.

## 2015-02-28 ENCOUNTER — Telehealth: Payer: Self-pay | Admitting: Medical

## 2015-02-28 ENCOUNTER — Encounter: Payer: Self-pay | Admitting: Medical

## 2015-02-28 DIAGNOSIS — E1165 Type 2 diabetes mellitus with hyperglycemia: Secondary | ICD-10-CM | POA: Insufficient documentation

## 2015-02-28 DIAGNOSIS — IMO0002 Reserved for concepts with insufficient information to code with codable children: Secondary | ICD-10-CM | POA: Insufficient documentation

## 2015-02-28 DIAGNOSIS — T889XXA Complication of surgical and medical care, unspecified, initial encounter: Secondary | ICD-10-CM | POA: Insufficient documentation

## 2015-02-28 DIAGNOSIS — R197 Diarrhea, unspecified: Secondary | ICD-10-CM | POA: Insufficient documentation

## 2015-02-28 DIAGNOSIS — J449 Chronic obstructive pulmonary disease, unspecified: Secondary | ICD-10-CM | POA: Insufficient documentation

## 2015-02-28 NOTE — Telephone Encounter (Signed)
Refer to [facility] for eval and treatment of anxiety, long term use of Xanax, recently started Wellbutrin, heavy smoker  Refer to podiatry for chronic ankle pain.  Make sure they understand that she had prior surgery, has chronic pain, and has been on chronic narcotics.  She is new to use and I have her on Ultram but it isn't helping.  If they won't see her, then refer to [facility].

## 2015-02-28 NOTE — Progress Notes (Signed)
Subjective: Here for recheck, upset about medications.   I just saw her last week as a new patient.  She relocated to [facility] back in 07/2014.  Moved from [place].   Was seeing 1 doctor back in [place] for all her medications.    Her medical problems include diabetes, hypertension, hyperlipidemia, hx/o 7 TIAs, COPD, hx/o MI and heart disease, heavy smoker 2ppd, anxiety, hx/o suicide attempts in the past.    Lives alone.  Has numerous health issues.   Works at [employer], has to be focused and diarrhea is problems with loose stool having to run to the bathroom, attributed to metformin.   Has chronic foot pains.   Has anxiety.     Diabetes - last visit last week she had been out of metformin for a few weeks and the diarrhea resolved.  However, she notes that since starting the samples of Kombiglyze, the diarrhea has returned.  Refuses to take any more metformin.  She is awaiting her test strips from mail order pharmacy so is currently not testing glucose.  However ,the last readings she has from the last week or so are all in the mid 200s to low 300s.   Recent HgbA1C over 10%.   Doesn't want to start injectables, wants something other than metformin.   Has only ever been on metformin.  She is upset that I didn't refill Xanax XR 2mg , so she is having to use 0.5mg  xanax, 4 at a time, has started Wellbutrin for anxiety and smoking cessation.    She made the other medications changes from last visit and thankfully BP is looking much better .  Hx/o heart disease, had catheterization 2008.  No stents, no CABG.   Thinks she had MI but not sure.  Had 7 TIAs in 2006.  Not currently on statin or aspirin  Can't stop smoking on her own. Both parents worked for [employer].  Smokes 2ppd.   Uses Breo inhaler for COPD.    Has hx/o chronic ankle pain from prior fracture left ankle.   Foot hurts on regular basis.   Has been on long term narcotic pain medication.   States the ultram isn't helping.  Needs to go  back on what she was on prior.  Objective: BP 120/90 mmHg  Pulse 80  Resp 15  Wt 199 lb (90.266 kg)  General appearance: alert, no distress, WD/WN, AA female somewhat raspy voice Oral cavity: MMM, no lesions Neck: supple, no lymphadenopathy, no thyromegaly, no masses Heart: 2/6 systolic brief murmur heard best in left upper sternal border, otherwise RRR, normal S1, S2, no murmurs Lungs: CTA bilaterally, no wheezes, rhonchi, or rales Surgical scars of medial and lateral left ankle, tender throughout left ankle, no swelling Extremities: no edema, no cyanosis, no clubbing Pulses: 1+ symmetric, upper and lower extremities, normal cap refill Psychiatric: normal affect, behavior normal, pleasant     Assessment: Encounter Diagnoses  Name Primary?  . Generalized anxiety disorder Yes  . Type II diabetes mellitus, uncontrolled   . Heavy smoker   . Diarrhea   . Side effects of treatment, subsequent encounter   . Hyperlipidemia   . Chronic ankle pain, left   . Heart disease   . Essential hypertension   . History of TIA (transient ischemic attack)   . COPD, severity to be determined     Plan:  I reviewed and discussed her numerous health issues, complicated by prior use of narcotics and xanax which are controled substances.  We discussed her heavy smoking, her uncontrolled diabetes.   I reviewed the limited records available.      Recommendations:  Tobacco use , history of stroke, heart disease  Strongly counseled on need to STOP smoking!!!  c/t daily  baby aspirin at bedtime started last visit  C/t Wellbutrin   Anxiety  c/t Wellbutrin  XL started last visit for anxiety and tobacco cessation  Use Xanax as needed for panic attack or worse anxiety, but referral to psychology, and advised she make appt with psychiatry, gave resources/contact info   Diabetes:  STOP plain metformin, stop kombigyze.  Begin Glyxambi  Check glucose, recheck in  85mo   Hypertension  c/t Lisinopril 20/12.5mg  daily in the morning started last visit  c/t Norvasc  daily in the morning started last visit  c/t Clonidine 0.1mg  daily at bedtime for sleep and blood pressure started last visit   Insomnia   c/t Clonidine 0.1mg  daily at bedtime for sleep and blood pressure   Chronic ankle pain   Can use 1-2 Ultram at a time.  Referral to podiatry or pain management depending who will take her   COPD  Continue Breo inhaler 1 inhalation once daily, rinse out mouth with water after use  Use albuterol inhaler as needed for shortness of breath     F/u 85mo with glucose readings.

## 2015-03-03 ENCOUNTER — Other Ambulatory Visit: Payer: Self-pay | Admitting: Family Medicine

## 2015-03-03 DIAGNOSIS — F411 Generalized anxiety disorder: Secondary | ICD-10-CM

## 2015-03-03 NOTE — Telephone Encounter (Signed)
I FAX OVER THE PATIENTS INFORMATION TO THE PLACE BELOW AND THEY WILL CONTACT THE PATIENT FOR HER APPOINTMENT  [facility], [place], [place]  Website  Directions  4.07 Google reviews   Doctor  Address: [address], [place], [place] [address]  Phone: [phone]

## 2015-03-03 NOTE — Telephone Encounter (Signed)
I left a message for [facility] [phone] to cb. [facility]

## 2015-03-05 ENCOUNTER — Telehealth: Payer: Self-pay | Admitting: Internal Medicine

## 2015-03-05 ENCOUNTER — Telehealth: Payer: Self-pay | Admitting: Medical

## 2015-03-05 NOTE — Telephone Encounter (Signed)
Pt called and states that her copay is $35.00 for Glyxambi and she wants something with no copay. Please call in new rx to [facility]

## 2015-03-05 NOTE — Telephone Encounter (Signed)
Patient called about Rx discount card. I relayed information to her that the pharm. Rep gave me earlier. She states she did activate the card, she has called the phone number on the card and they told her to have pharmacy to call and they would walk them thru to get copay to 0 but she states it still comes up as $35.00 copay. She is going to go back to pharmacy again and see if she can get them to reprocess it  She states medication is working

## 2015-03-05 NOTE — Telephone Encounter (Signed)
Patient called back again (see previous message)  She spoke to pharmacy and they told her that this medication was not covered under her insurance and that is why they can not get it to go thru for 0 copay. They tried to run it thru again just to be sure. Patient states you were going to give her Rx that is covered under her insurance. She states she can't pay $35.00   Please contact patient

## 2015-03-05 NOTE — Telephone Encounter (Signed)
pls call patient, make sure she activated the copay card as it should make the copy 0.    I spoke to rep today, and she likely hasn't activated the card.

## 2015-03-05 NOTE — Telephone Encounter (Signed)
I just spoke to drug rep.  Have him work with pharmacy as this still doesn't sound correct.  Drug rep says with copay card either it would be zero copay, or over $100, so $35 sounds incorrect.  Something isn't right.  This is the best current choice as she can't take metformin, and the other options i recommend would require injectables.

## 2015-03-06 ENCOUNTER — Telehealth: Payer: Self-pay | Admitting: Medical

## 2015-03-06 NOTE — Telephone Encounter (Signed)
Initiated P.A. Glyxambi  

## 2015-03-06 NOTE — Telephone Encounter (Signed)
Pt states she called her insurance company & Trey Sailors is requiring a P.A.  She states she has tried and failed Metformin & Kombiglyze both caused diarrhea

## 2015-03-10 ENCOUNTER — Telehealth: Payer: Self-pay | Admitting: Internal Medicine

## 2015-03-10 DIAGNOSIS — F411 Generalized anxiety disorder: Secondary | ICD-10-CM

## 2015-03-10 NOTE — Telephone Encounter (Signed)
[facility] called stating that we could do the referral in epic and they would pick it up or we could fax @ [phone]. I have put in epic for them to pick up in epic and call pt

## 2015-03-10 NOTE — Telephone Encounter (Signed)
i sent referral into Epic to [facility] as they leave a message stating they would pick it up and call pt.

## 2015-03-11 ENCOUNTER — Telehealth: Payer: Self-pay | Admitting: Family Medicine

## 2015-03-11 NOTE — Telephone Encounter (Signed)
    [name], CCC-SLP  [name]            FYI: We called your patient, Susan Mcclure, MRN: [id number] and she has declined to schedule an appointment with [place], at this time.   She is going somewhere else.   We appreciate the referral.         Patient states that she see's someone at the office below:  [facility]  Directions  Counselor  Address: [address] [place], [place] [address]  Phone: [phone]

## 2015-03-11 NOTE — Telephone Encounter (Signed)
pls check on this.  Part of the agreement to work with her on medications and treatment for anxiety is psychology and counseling.  She needs to see psychology as discussed for eval and counseling for me to work with her.  So she can schedule an appt, but needs to sign HIPPA so I can communicate with the psychologist.

## 2015-03-12 ENCOUNTER — Other Ambulatory Visit: Payer: Self-pay | Admitting: Medical

## 2015-03-12 MED ORDER — EMPAGLIFLOZIN 25 MG PO TABS: 25.0000 mg | ORAL_TABLET | Freq: Every day | ORAL | Status: DC

## 2015-03-12 NOTE — Telephone Encounter (Signed)
Insurance not wanting to pay for Glyxambi.  Thus, have her begin Jardiance daily and recheck diabetes markers in 68mo unless having problems in the meantime controlling sugars.  F/u otherwise sooner as scheduled or discussed last visit

## 2015-03-12 NOTE — Telephone Encounter (Signed)
P.A. Trey Sailors denied, pt must have 3 month trial of Jardiance or Tradjenta  Do you want to switch?

## 2015-03-14 ENCOUNTER — Telehealth: Payer: Self-pay | Admitting: Internal Medicine

## 2015-03-14 ENCOUNTER — Institutional Professional Consult (permissible substitution): Payer: 59 | Admitting: Internal Medicine

## 2015-03-14 NOTE — Telephone Encounter (Signed)
Faxed over medical records to [facility] @ [phone]

## 2015-03-14 NOTE — Telephone Encounter (Signed)
Now Jardiance requiring P.A. So Initiated P.A. Jardiance

## 2015-03-18 NOTE — Telephone Encounter (Signed)
P.A.  [place] Pepper approved til 03/13/16, faxed pharmacy, left message for pt

## 2015-03-27 ENCOUNTER — Ambulatory Visit: Payer: 59 | Admitting: Medical

## 2015-04-03 ENCOUNTER — Ambulatory Visit: Payer: 59 | Admitting: Medical

## 2015-04-03 ENCOUNTER — Telehealth: Payer: Self-pay

## 2015-04-03 NOTE — Telephone Encounter (Signed)
D, needs f/u

## 2015-04-03 NOTE — Telephone Encounter (Signed)
This patient no showed for their appointment today.Which of the following is necessary for this patient.   A) No follow-up necessary   B) Follow-up urgent. Locate Patient Immediately.   C) Follow-up necessary. Contact patient and Schedule visit in ____ Days.   D) Follow-up Advised. Contact patient and Schedule visit in ____ Days. 

## 2015-04-07 ENCOUNTER — Telehealth: Payer: Self-pay | Admitting: Medical

## 2015-04-07 NOTE — Telephone Encounter (Signed)
Done referral through [facility] for pt. Looks like in appt notes for pulmonary, pt is going for COPD (J44.9) with Dr. Sandrea Hughs. Faxed over referral info to [facility] pulmonary @ [phone]

## 2015-04-07 NOTE — Telephone Encounter (Signed)
Susan Mcclure pulmonary called wanting a referral to see this patient her appt is Thursday 25th 2016

## 2015-04-10 ENCOUNTER — Institutional Professional Consult (permissible substitution): Payer: 59 | Admitting: Internal Medicine

## 2015-04-11 ENCOUNTER — Encounter: Payer: Self-pay | Admitting: Medical

## 2015-04-11 NOTE — Telephone Encounter (Signed)
No show letter sent 04/11/2015

## 2015-04-29 ENCOUNTER — Telehealth: Payer: Self-pay

## 2015-04-29 ENCOUNTER — Other Ambulatory Visit: Payer: Self-pay

## 2015-04-29 MED ORDER — BUPROPION HCL ER (XL) 150 MG PO TB24: 150.0000 mg | ORAL_TABLET | Freq: Every day | ORAL | Status: DC

## 2015-04-29 MED ORDER — ALBUTEROL SULFATE HFA 108 (90 BASE) MCG/ACT IN AERS: 2.0000 | INHALATION_SPRAY | Freq: Four times a day (QID) | RESPIRATORY_TRACT | Status: AC | PRN

## 2015-04-29 MED ORDER — EMPAGLIFLOZIN 25 MG PO TABS: 25.0000 mg | ORAL_TABLET | Freq: Every day | ORAL | Status: DC

## 2015-04-29 MED ORDER — CLONIDINE HCL 0.1 MG PO TABS: 0.1000 mg | ORAL_TABLET | Freq: Every day | ORAL | Status: DC

## 2015-04-29 MED ORDER — AMLODIPINE BESYLATE 5 MG PO TABS: 5.0000 mg | ORAL_TABLET | Freq: Every day | ORAL | Status: DC

## 2015-04-29 MED ORDER — FLUTICASONE FUROATE-VILANTEROL 200-25 MCG/INH IN AEPB: 1.0000 | INHALATION_SPRAY | Freq: Every day | RESPIRATORY_TRACT | Status: DC

## 2015-04-29 MED ORDER — LISINOPRIL-HYDROCHLOROTHIAZIDE 20-12.5 MG PO TABS: 1.0000 | ORAL_TABLET | Freq: Every day | ORAL | Status: DC

## 2015-04-29 NOTE — Telephone Encounter (Signed)
Pt called and says she is out of all of her meds and needs refills. Xanax 0.5mg  #30, Amlodipine 0.5mg  #30, Wellbutrin  #30, Clonidine 0.1 #30, Jardiance  #30, Fluticasone 200-62mcg/inh AEPD #28 each, Lisinopril-hydrochlorothiazide 20-12.5mg  #30, and Tramadol  #45

## 2015-04-29 NOTE — Telephone Encounter (Signed)
Pt made appt for oct. All non controlled medications filled. Informed pt and she requested xanax and tramadol be refilled as well. Please inform pt.

## 2015-04-29 NOTE — Telephone Encounter (Signed)
See prior messages.  She was due back in f/u 29mo after last visit, no showed (see phone trail).  So, make her an appt, and refill all non-controlled meds for 29mo.

## 2015-05-01 NOTE — Telephone Encounter (Signed)
Per Vincenza Hews pt must have follow up before controlled meds will be filled.  Called pt back and advised, she states she lost her job last month & phone was cut off and couldn't pay co pay to come in.  She is having panic attacks and so we made arrangement for pt to be seen tomorrow.

## 2015-05-02 ENCOUNTER — Ambulatory Visit (INDEPENDENT_AMBULATORY_CARE_PROVIDER_SITE_OTHER): Payer: 59 | Admitting: Medical

## 2015-05-02 ENCOUNTER — Encounter: Payer: Self-pay | Admitting: Medical

## 2015-05-02 VITALS — BP 130/80 | HR 74 | Temp 98.3°F | Resp 18 | Ht 63.0 in | Wt 197.4 lb

## 2015-05-02 DIAGNOSIS — J449 Chronic obstructive pulmonary disease, unspecified: Secondary | ICD-10-CM | POA: Diagnosis not present

## 2015-05-02 DIAGNOSIS — I10 Essential (primary) hypertension: Secondary | ICD-10-CM | POA: Diagnosis not present

## 2015-05-02 DIAGNOSIS — E1165 Type 2 diabetes mellitus with hyperglycemia: Secondary | ICD-10-CM

## 2015-05-02 DIAGNOSIS — M25579 Pain in unspecified ankle and joints of unspecified foot: Secondary | ICD-10-CM | POA: Diagnosis not present

## 2015-05-02 DIAGNOSIS — F411 Generalized anxiety disorder: Secondary | ICD-10-CM

## 2015-05-02 DIAGNOSIS — F17200 Nicotine dependence, unspecified, uncomplicated: Secondary | ICD-10-CM | POA: Diagnosis not present

## 2015-05-02 DIAGNOSIS — E785 Hyperlipidemia, unspecified: Secondary | ICD-10-CM | POA: Diagnosis not present

## 2015-05-02 DIAGNOSIS — G8929 Other chronic pain: Secondary | ICD-10-CM | POA: Diagnosis not present

## 2015-05-02 DIAGNOSIS — R197 Diarrhea, unspecified: Secondary | ICD-10-CM | POA: Diagnosis not present

## 2015-05-02 DIAGNOSIS — IMO0002 Reserved for concepts with insufficient information to code with codable children: Secondary | ICD-10-CM

## 2015-05-02 DIAGNOSIS — I519 Heart disease, unspecified: Secondary | ICD-10-CM | POA: Diagnosis not present

## 2015-05-02 DIAGNOSIS — Z8673 Personal history of transient ischemic attack (TIA), and cerebral infarction without residual deficits: Secondary | ICD-10-CM

## 2015-05-02 MED ORDER — ALPRAZOLAM 0.5 MG PO TABS: 0.5000 mg | ORAL_TABLET | Freq: Every evening | ORAL | Status: AC | PRN

## 2015-05-02 MED ORDER — CLONIDINE HCL 0.1 MG PO TABS: 0.1000 mg | ORAL_TABLET | Freq: Every day | ORAL | Status: AC

## 2015-05-02 MED ORDER — EMPAGLIFLOZIN 25 MG PO TABS: 25.0000 mg | ORAL_TABLET | Freq: Every day | ORAL | Status: AC

## 2015-05-02 MED ORDER — LISINOPRIL-HYDROCHLOROTHIAZIDE 20-12.5 MG PO TABS: 1.0000 | ORAL_TABLET | Freq: Every day | ORAL | Status: AC

## 2015-05-02 MED ORDER — GLIPIZIDE 5 MG PO TABS: 5.0000 mg | ORAL_TABLET | Freq: Two times a day (BID) | ORAL | Status: AC

## 2015-05-02 MED ORDER — FLUTICASONE FUROATE-VILANTEROL 200-25 MCG/INH IN AEPB: 1.0000 | INHALATION_SPRAY | Freq: Every day | RESPIRATORY_TRACT | Status: AC

## 2015-05-02 MED ORDER — BUPROPION HCL ER (XL) 300 MG PO TB24: 300.0000 mg | ORAL_TABLET | Freq: Every day | ORAL | Status: AC

## 2015-05-02 MED ORDER — TRAMADOL HCL 50 MG PO TABS: 50.0000 mg | ORAL_TABLET | Freq: Two times a day (BID) | ORAL | Status: AC | PRN

## 2015-05-02 MED ORDER — TRAMADOL HCL 50 MG PO TABS: 50.0000 mg | ORAL_TABLET | Freq: Two times a day (BID) | ORAL | Status: DC | PRN

## 2015-05-02 MED ORDER — AMLODIPINE BESYLATE 5 MG PO TABS: 5.0000 mg | ORAL_TABLET | Freq: Every day | ORAL | Status: AC

## 2015-05-02 NOTE — Progress Notes (Signed)
Subjective: Here for recheck.     Of note, she was scheduled for 8:15 for 30 min appt, and right away said her husband is in the car, has to be at work in 5 min and needs to leave.  Thus we rescheduled her for 9:15am today.  She is here for refills on medications.  Her non controlled meds were refilled few days ago and we advised she would need appt to get refills on Xanax and pain medication.     Interim history: We discussed medication compliance, need for routine f/u, need for compliance last visit back on 02/27/15.  She notes that she no showed on 04/03/15 because she had no money to come in as she lost her job.    At last visit I advised that for me to work with her she would need to establish with psychology given her complications prior psychiatric history, and she agreed.  However, I received noticed on 03/11/15 that she declined the appt when psychology office called to set her up appt.  She notes today that she was confused on whether or not she needs to see psychiatry vs psychology, and insurance was apparenlty an issue.  She has numerous health issues, numerous significant medical problems, and psychiatric problems.  She has now no showed once, she hasn't been all that compliant with diabetes treatment recommendations since establishing care here 02/20/15.    Diabetes type 2- after last visit we changed to Jardiance since she refused any form of Metformin due to loose stools.   She is compliant but sugars in the 150-200 range.   COPD, smoker - she had wanted to establish with Dr. Sherene Sires, but had to hold off given insurance.  Using Breo inhaler, working well.   She has hx/o anxiety - hx/o anxiety, hxo suicide attempt in the past.  Wellbutrin not working . 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     She has hx/o chronic foot pain - Has hx/o chronic ankle pain from prior fracture left ankle. Foot hurts on regular basis. Has been on long term narcotic pain  medication.   ROS as in subjective  Past Medical History  Diagnosis Date  . Emphysema/COPD   . Asthma   . Hypertension   . Diabetes mellitus without complication   . Anxiety   . H/O suicide attempt     remote past  . Heavy smoker   . Hyperlipidemia   . Hx of TIA (transient ischemic attack) and stroke   . Heart murmur   . Heart disease   . Chronic pain of left ankle   . Insomnia      Objective: BP 130/80 mmHg  Pulse 74  Temp(Src) 98.3 F (36.8 C) (Oral)  Resp 18  Ht  (1.6 m)  Wt 197 lb 6.4 oz (89.54 kg)  BMI 34.98 kg/m2  General appearance: alert, no distress, WD/WN, AA female somewhat raspy voice Oral cavity: MMM, no lesions Neck: supple, no lymphadenopathy, no thyromegaly, no masses Heart: 2/6 systolic brief murmur heard best in left upper sternal border, otherwise RRR, normal S1, S2, no murmurs Lungs: CTA bilaterally, no wheezes, rhonchi, or rales Surgical scars of medial and lateral left ankle, tender throughout left ankle, no swelling Extremities: no edema, no cyanosis, no clubbing Pulses: 1+ symmetric, upper and lower extremities, normal cap refill Psychiatric: normal affect, behavior normal, pleasant   Assessment: Encounter Diagnoses  Name Primary?  . Generalized anxiety disorder Yes  .  Chronic ankle pain, unspecified laterality   . Essential hypertension   . Heavy smoker   . Heart disease   . History of TIA (transient ischemic attack)   . Hyperlipidemia   . Type II diabetes mellitus, uncontrolled   . Diarrhea   . COPD, severity to be determined     Plan: Discussed her no show recently, need to have courteous to call us prior to a visit to reschedule and the possibility of no show fee or dismissal. Discussed need for compliance including medications, treatment recommendations, counseling, etc., that we recommend  discussed her overall state of health, diet, smoking status, and need for better control over her health.    Anxiety - increase  to Wellbutrin 300mg  XL daily, c/t xanax.  Chronic left ankle pain - c/t Ultram  Discussed controled substances, using 1 pharmacy, having routine f/u, and that if she no shows or doesn't make scheduled f/u visits, this would be grounds for dismissal or cessation of controlled substances, discussed random drug testing.    Diabetes - HgbA1C @ 9 % today.  C/t Jardiance, add glipizide BID, diabetic diet, exericse.  Patient voiced understanding of diagnosis, recommendations, and treatment plan.  F/u 51mo.

## 2015-05-19 ENCOUNTER — Encounter: Payer: Self-pay | Admitting: Medical

## 2015-07-21 ENCOUNTER — Telehealth: Payer: Self-pay

## 2015-07-21 ENCOUNTER — Encounter: Payer: 59 | Admitting: Medical

## 2015-07-21 NOTE — Telephone Encounter (Signed)
This patient no showed for their appointment today.Which of the following is necessary for this patient.   A) No follow-up necessary   B) Follow-up urgent. Locate Patient Immediately.   C) Follow-up necessary. Contact patient and Schedule visit in ____ Days.   D) Follow-up Advised. Contact patient and Schedule visit in ____ Days. 

## 2015-07-21 NOTE — Telephone Encounter (Signed)
D, and find out how many no shows?   We may have to discharge her.

## 2015-07-25 ENCOUNTER — Encounter: Payer: Self-pay | Admitting: Medical

## 2015-07-25 NOTE — Telephone Encounter (Signed)
Pt has had one previous no show and received first letter. I will be sending second no show letter today

## 2015-08-26 ENCOUNTER — Telehealth: Payer: Self-pay

## 2015-08-26 NOTE — Telephone Encounter (Signed)
Tried to contact patient regarding flu vaccine.

## 2016-01-09 ENCOUNTER — Inpatient Hospital Stay
Admit: 2016-01-09 | Discharge: 2016-01-09 | Disposition: A | Discharge status: Home / Self Care | Payer: Medicaid - Out of State | Attending: Emergency Medicine

## 2016-01-09 DIAGNOSIS — S40862A Insect bite (nonvenomous) of left upper arm, initial encounter: Secondary | ICD-10-CM

## 2016-01-09 MED ORDER — DEXAMETHASONE SODIUM PHOSPHATE 10 MG/ML IJ SOLN
10 mg/mL | INTRAMUSCULAR | Status: AC
Start: 2016-01-09 — End: 2016-01-09
  Administered 2016-01-09: 16:00:00 via ORAL

## 2016-01-09 MED ORDER — PREDNISONE 20 MG TAB
20 mg | ORAL_TABLET | Freq: Every day | ORAL | 0 refills | Status: AC
Start: 2016-01-09 — End: 2016-01-14

## 2016-01-09 MED ORDER — HYDROXYZINE 50 MG TAB
50 mg | ORAL_TABLET | Freq: Four times a day (QID) | ORAL | 0 refills | Status: DC | PRN
Start: 2016-01-09 — End: 2016-04-06

## 2016-01-09 MED FILL — DEXAMETHASONE SODIUM PHOSPHATE 10 MG/ML IJ SOLN: 10 mg/mL | INTRAMUSCULAR | Qty: 1

## 2016-01-09 NOTE — ED Notes (Signed)
Pt reported she went to visit her aunt house x 6 days ago,she had bedbug bite so she left from that place and took shower and never go back but till she had the rashes all over the body.Denies n/v/d,fever,chills.   Emergency Department Nursing Plan of Care       The Nursing Plan of Care is developed from the Nursing assessment and Emergency Department Attending provider initial evaluation.  The plan of care may be reviewed in the ???ED Provider note???.    The Plan of Care was developed with the following considerations:   Patient / Family readiness to learn indicated [id number] understanding  Persons(s) to be included in education: patient  Barriers to Learning/Limitations:No    Signed     Franchot Heidelberg, RN    01/09/2016   11:49 AM

## 2016-01-09 NOTE — ED Notes (Signed)
D/c by provider so unable to assess d/c pain level.

## 2016-01-09 NOTE — ED Triage Notes (Signed)
Pt stayed at family members house 6 days ago, pt has multiple red, raised bumps all over body.

## 2016-01-09 NOTE — ED Provider Notes (Signed)
HPI Comments: Pt reports she was staying at her aunts house a week ago, when she saw a bed bug crawling on her. Pt left immediately, showered, and washed her clothes. Pt has not been back to house since. Pt reports she has noticed increased number of bites, swelling, and itching "all over body". Denies fever/chills or drainage from areas. Pt has taken Benadryl with no relief.     Patient is a 55 y.o. female presenting with Insect Bite. The history is provided by the patient.   Insect Bite   This is a new problem. Episode onset: x 1 week. Pertinent negatives include no chest pain, no abdominal pain and no shortness of breath. Nothing aggravates the symptoms. Nothing relieves the symptoms. Treatments tried: Bendadryl. The treatment provided no relief.        History reviewed. No pertinent past medical history.    History reviewed. No pertinent surgical history.      History reviewed. No pertinent family history.    Social History     Social History   ??? Marital status: MARRIED     Spouse name: N/A   ??? Number of children: N/A   ??? Years of education: N/A     Occupational History   ??? Not on file.     Social History Main Topics   ??? Smoking status: Current Every Day Smoker     Packs/day: 1.00   ??? Smokeless tobacco: Not on file   ??? Alcohol use No   ??? Drug use: No   ??? Sexual activity: Not on file     Other Topics Concern   ??? Not on file     Social History Narrative   ??? No narrative on file         ALLERGIES: Review of patient's allergies indicates not on file.    Review of Systems   Constitutional: Negative for chills and fever.   Respiratory: Negative for shortness of breath.    Cardiovascular: Negative for chest pain.   Gastrointestinal: Negative for abdominal pain, nausea and vomiting.   Genitourinary: Negative for flank pain.   Musculoskeletal: Negative for back pain and myalgias.   Skin: Positive for color change and rash. Negative for pallor and wound.    Neurological: Negative for dizziness, weakness and light-headedness.   All other systems reviewed and are negative.      Vitals:    01/09/16 1125   BP: (!) 155/95   Pulse: 91   Resp: 16   Temp: 98.3 ??F (36.8 ??C)   SpO2: 97%   Weight: 90.7 kg (200 lb)   Height: 5\' 2"  (1.575 m)            Physical Exam   Constitutional: She is oriented to person, place, and time. She appears well-developed and well-nourished. No distress.   HENT:   Head: Normocephalic and atraumatic.   Eyes: Conjunctivae are normal.   Cardiovascular: Normal rate, regular rhythm and normal heart sounds.    Pulmonary/Chest: Effort normal and breath sounds normal. No respiratory distress.   Abdominal: Soft. Bowel sounds are normal. She exhibits no distension.   Musculoskeletal: Normal range of motion.   Neurological: She is alert and oriented to person, place, and time.   Skin: Skin is warm. Rash noted. No erythema.   Multiple < 1cm erythematous warm lesions to arms and legs b/l, trunk and back. No lesions in webbing of fingers.    Psychiatric: She has a normal mood and affect. Her behavior is normal.  Nursing note and vitals reviewed.       MDM  Number of Diagnoses or Management Options  Diagnosis management comments: DDx: Bed Bugs, Scabies, Contact Dermatitis, Chigger Bites    ED Course       Procedures      LABORATORY TESTS:  No results found for this or any previous visit (from the past 12 hour(s)).    IMAGING RESULTS:  No orders to display       MEDICATIONS GIVEN:  Medications   dexamethasone (DECADRON) injection 10 mg (10 mg Oral Given 01/09/16 1223)       IMPRESSION:  1. Bed bug bite, initial encounter        PLAN:  1.   Discharge Medication List as of 01/09/2016 12:16 PM      START taking these medications    Details   hydrOXYzine HCl (ATARAX) 50 mg tablet Take 1 Tab by mouth every six (6) hours as needed for Itching., Print, Disp-20 Tab, R-0      predniSONE (DELTASONE) 20 mg tablet Take 1 Tab by mouth daily for 5 doses., Print, Disp-5 Tab, R-0            2.   Follow-up Information     Follow up With Details Comments [place] Schedule an appointment as soon as possible for a visit As needed for PCP follow up [place]  [phone]        Return to ED if worse

## 2016-01-14 ENCOUNTER — Inpatient Hospital Stay
Admit: 2016-01-14 | Discharge: 2016-01-14 | Disposition: A | Discharge status: Home / Self Care | Payer: PRIVATE HEALTH INSURANCE | Attending: Emergency Medicine

## 2016-01-14 ENCOUNTER — Emergency Department: Admit: 2016-01-14 | Payer: PRIVATE HEALTH INSURANCE | Primary: Family

## 2016-01-14 DIAGNOSIS — S93402A Sprain of unspecified ligament of left ankle, initial encounter: Secondary | ICD-10-CM

## 2016-01-14 MED ORDER — IBUPROFEN 400 MG TAB
400 mg | ORAL | Status: DC
Start: 2016-01-14 — End: 2016-01-14

## 2016-01-14 MED ORDER — TRIAMCINOLONE 0.1 % TOPICAL OINTMENT AND DIMETHICONE 5 % TOPICAL CREAM
Freq: Two times a day (BID) | CUTANEOUS | 0 refills | Status: DC
Start: 2016-01-14 — End: 2016-02-27

## 2016-01-14 MED ORDER — IBUPROFEN 800 MG TAB
800 mg | ORAL_TABLET | Freq: Four times a day (QID) | ORAL | 0 refills | Status: AC | PRN
Start: 2016-01-14 — End: 2016-01-21

## 2016-01-14 MED FILL — IBUPROFEN 400 MG TAB: 400 mg | ORAL | Qty: 2

## 2016-01-14 NOTE — ED Notes (Signed)
Patient is upset because the provider would not prescribed her narcotics and because he said that narcotics were not indicated for the diagnosis.  Provider was very pleasant to the patient, the patient was verbally aggressive to providier

## 2016-01-14 NOTE — ED Notes (Signed)
Patient (s)  given copy of dc instructions and 2 paper script(s) and 0 electronic scripts.  Patient (s)  verbalized understanding of instructions and script (s).  Patient given a current medication reconciliation form and verbalized understanding of their medications.   Patient (s) verbalized understanding of the importance of discussing medications with  his or her physician or clinic they will be following up with.  Patient alert and oriented and in no acute distress.  Patient offered wheelchair from treatment area to hospital entrance, patient declines wheelchair.

## 2016-01-14 NOTE — ED Notes (Signed)
Patient is still in her room waiting for the MD to come back and see her.  She states her rash was not addressed as well

## 2016-01-14 NOTE — ED Provider Notes (Signed)
HPI Comments: Kathy Bush is a 55 y.o. female with significant PMHx of chronic ankle pain, presents ambulatory to the ED with c/o acute onset of constant sharp 8/10 left ankle pain s/p GLF fall x 6 hours PTA. She notes associated left ankle swelling. Pt reports she was walking when she "twisted" her ankle. She states that she is from [place] and is visiting family in [place]. Pt denies head injury, LOC, fever, chills, chest pain, or SOB.     PCP: PROVIDER UNKNOWN  Social Hx: +tobacco (1 pack a day), -EtOH    There are no other complaints, changes or physical findings at this time.   Written by Reynold Bowen, ED Scribe, as dictated by Bedelia Person, MD      The history is provided by the patient. No language interpreter was used.        No past medical history on file.    No past surgical history on file.      No family history on file.    Social History     Social History   ??? Marital status: MARRIED     Spouse name: N/A   ??? Number of children: N/A   ??? Years of education: N/A     Occupational History   ??? Not on file.     Social History Main Topics   ??? Smoking status: Current Every Day Smoker     Packs/day: 1.00   ??? Smokeless tobacco: Not on file   ??? Alcohol use No   ??? Drug use: No   ??? Sexual activity: Not on file     Other Topics Concern   ??? Not on file     Social History Narrative   ??? No narrative on file         ALLERGIES: Review of patient's allergies indicates no known allergies.    Review of Systems   Constitutional: Negative.  Negative for activity change, appetite change, chills, fatigue, fever and unexpected weight change.   HENT: Negative.  Negative for congestion, hearing loss, rhinorrhea, sneezing and voice change.         - head injury   Eyes: Negative.  Negative for pain and visual disturbance.   Respiratory: Negative.  Negative for apnea, cough, choking, chest tightness and shortness of breath.    Cardiovascular: Negative.  Negative for chest pain and palpitations.    Gastrointestinal: Negative.  Negative for abdominal distention, abdominal pain, blood in stool, diarrhea, nausea and vomiting.   Genitourinary: Negative.  Negative for difficulty urinating, flank pain, frequency and urgency.        No discharge   Musculoskeletal: Positive for arthralgias (left ankle) and joint swelling (left ankle). Negative for back pain, myalgias and neck stiffness.   Skin: Negative.  Negative for color change and rash.   Neurological: Negative.  Negative for dizziness, seizures, syncope, speech difficulty, weakness, numbness and headaches.        - LOC   Hematological: Negative for adenopathy.   Psychiatric/Behavioral: Negative.  Negative for agitation, behavioral problems, dysphoric mood and suicidal ideas. The patient is not nervous/anxious.        Vitals:    01/14/16 1614   BP: (!) 178/98   Pulse: 76   Resp: 18   Temp: 98.2 ??F (36.8 ??C)   SpO2: 100%   Weight: 90.1 kg (198 lb 11.2 oz)   Height: 5\' 2"  (1.575 m)            Physical Exam  Constitutional: She is oriented to person, place, and time. She appears well-developed and well-nourished.   Eyes: Conjunctivae and EOM are normal. Pupils are equal, round, and reactive to light.   Cardiovascular: Normal rate, regular rhythm and normal heart sounds.  Exam reveals no gallop and no friction rub.    No murmur heard.  Pulmonary/Chest: Effort normal and breath sounds normal. No respiratory distress. She has no wheezes. She has no rales.   Musculoskeletal: Normal range of motion. She exhibits no edema.        Left ankle: She exhibits no deformity. Tenderness.   Neurological: She is alert and oriented to person, place, and time. She has normal strength. No cranial nerve deficit or sensory deficit. She displays a negative Romberg sign. Coordination and gait normal.   Skin: Skin is warm and dry. No ecchymosis, no lesion and no rash noted. Rash is not urticarial. She is not diaphoretic. No erythema.   Psychiatric: She has a normal mood and affect.    Nursing note and vitals reviewed.       MDM  Number of Diagnoses or Management Options  Diagnosis management comments:     DDx: sprain, strain, fracture        Amount and/or Complexity of Data Reviewed  Tests in the radiology section of CPT??: ordered and reviewed    Patient Progress  Patient progress: stable    ED Course       Procedures    IMAGING RESULTS:  EXAM: XR ANKLE LT MIN 3 V  ??  INDICATION: Trauma. Left ankle pain  ??  COMPARISON: None.  ??  FINDINGS: Three views of the left ankle demonstrate no acute osseous  abnormality. There is evidence of ORIF of the medial malleolus with 2 partially  cannulated screws. There is also lateral displacement and multiple screws within  the distal fibula. This hardware appears intact. There is no other acute  osseous or articular abnormality. There is a plantar spur. 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  ??  IMPRESSION  IMPRESSION: No acute abnormality. Evidence of prior ORIF of the distal tibia  and fibula, appearing intact.    MEDICATIONS GIVEN:  Medications   ibuprofen (MOTRIN) tablet 800 mg (not administered)       IMPRESSION:  1. Sprain of left ankle, unspecified ligament, initial encounter        PLAN:  1.   Current Discharge Medication List      START taking these medications    Details   ibuprofen (MOTRIN) 800 mg tablet Take 1 Tab by mouth every six (6) hours as needed for Pain for up to 7 days.  Qty: 20 Tab, Refills: 0           2.   Follow-up Information     Follow up With Details Comments Contact Info    Provider Unknown In 2 days If symptoms worsen Patient not available to ask          Return to ED if worse     DISCHARGE NOTE  5:09 PM  The patient has been re-evaluated and is ready for discharge. Reviewed available results with patient. Counseled pt on diagnosis and care plan. Pt has expressed understanding, and all questions have been answered. Pt agrees with plan and agrees to F/U as recommended, or return to the ED if  their sxs worsen. Discharge instructions have been provided and explained to the pt, along with reasons to return to the ED.  Written by Reynold Bowen, ED Scribe,  as dictated by Bedelia Person, MD.      This note is prepared by Reynold Bowen, acting as Scribe for Bedelia Person, MD.    Bedelia Person, MD : The scribe's documentation has been prepared under my direction and personally reviewed by me in its entirety. I confirm that the note above accurately reflects all work, treatment, procedures, and medical decision making performed by me.

## 2016-01-14 NOTE — ED Notes (Signed)
Patient declined ibuprofen for pain and as a discharge prescription

## 2016-01-14 NOTE — ED Notes (Signed)
Patient here c/o left ankle pain and skin problem.  Patient states history of ankle problems, reports surgery in the past.  Patient states that she was walking and "I came down wrong on it" and led to pain.  Patient very guarding of ankle, at first refusing to have it be touched.      Patient also complains of scattered rash on arms trunk and legs.  Patient reports that she was at a family member's house a week ago and was exposed to bed bugs.  Patient states that she was bit by bugs all over and states she came into the hospital for treatment.  Patient states that she was upset that she was given medications and sent home with prescriptions however she states that "It never got any better!  I don't know why they didn't draw any blood on me or nothing! Child something is going on with this."          Emergency Department Nursing Plan of Care       The Nursing Plan of Care is developed from the Nursing assessment and Emergency Department Attending provider initial evaluation.  The plan of care may be reviewed in the ???ED Provider note???.    The Plan of Care was developed with the following considerations:   Patient / Family readiness to learn indicated [id number] understanding  Persons(s) to be included in education: patient  Barriers to Learning/Limitations:No    Signed     Manon Hilding, RN    01/14/2016   4:37 PM

## 2016-01-14 NOTE — ED Triage Notes (Signed)
Pt reports twisting left ankle when she fell off a curb approx 6 hours PTA, reports history of surgery to the left ankle.  Pt treated for bed bug bites a few days ago.

## 2016-02-08 ENCOUNTER — Emergency Department: Admit: 2016-02-08 | Payer: MEDICAID | Primary: Family

## 2016-02-08 ENCOUNTER — Inpatient Hospital Stay
Admit: 2016-02-08 | Discharge: 2016-02-08 | Disposition: A | Discharge status: Home / Self Care | Payer: MEDICAID | Attending: Emergency Medicine

## 2016-02-08 DIAGNOSIS — J4541 Moderate persistent asthma with (acute) exacerbation: Secondary | ICD-10-CM

## 2016-02-08 LAB — METABOLIC PANEL, BASIC
Anion gap: 10 mmol/L (ref 5–15)
BUN/Creatinine ratio: 12 (ref 12–20)
BUN: 12 MG/DL (ref 6–20)
CO2: 27 mmol/L (ref 21–32)
Calcium: 8.5 MG/DL (ref 8.5–10.1)
Chloride: 103 mmol/L (ref 97–108)
Creatinine: 0.99 MG/DL (ref 0.55–1.02)
GFR est AA: >60 mL/min/{1.73_m2} (ref >60)
GFR est non-AA: 58 mL/min/{1.73_m2} — ABNORMAL LOW (ref >60)
Glucose: 206 mg/dL — ABNORMAL HIGH (ref 65–100)
Potassium: 3.7 mmol/L (ref 3.5–5.1)
Sodium: 140 mmol/L (ref 136–145)

## 2016-02-08 LAB — CBC WITH AUTOMATED DIFF
ABS. BASOPHILS: 0 10*3/uL (ref 0.0–0.1)
ABS. EOSINOPHILS: 0.1 10*3/uL (ref 0.0–0.4)
ABS. LYMPHOCYTES: 2.7 10*3/uL (ref 0.8–3.5)
ABS. MONOCYTES: 0.4 10*3/uL (ref 0.0–1.0)
ABS. NEUTROPHILS: 4.1 10*3/uL (ref 1.8–8.0)
BASOPHILS: 0 % (ref 0–1)
EOSINOPHILS: 1 % (ref 0–7)
HCT: 41.5 % (ref 35.0–47.0)
HGB: 13.9 g/dL (ref 11.5–16.0)
LYMPHOCYTES: 37 % (ref 12–49)
MCH: 30.2 PG (ref 26.0–34.0)
MCHC: 33.5 g/dL (ref 30.0–36.5)
MCV: 90.2 FL (ref 80.0–99.0)
MONOCYTES: 5 % (ref 5–13)
NEUTROPHILS: 57 % (ref 32–75)
PLATELET: 226 10*3/uL (ref 150–400)
RBC: 4.6 M/uL (ref 3.80–5.20)
RDW: 13.7 % (ref 11.5–14.5)
WBC: 7.2 10*3/uL (ref 3.6–11.0)

## 2016-02-08 MED ORDER — DEXAMETHASONE SODIUM PHOSPHATE 10 MG/ML IJ SOLN
10 mg/mL | Freq: Once | INTRAMUSCULAR | Status: AC
Start: 2016-02-08 — End: 2016-02-08
  Administered 2016-02-08: 05:00:00 via INTRAMUSCULAR

## 2016-02-08 MED ORDER — IPRATROPIUM-ALBUTEROL 2.5 MG-0.5 MG/3 ML NEB SOLUTION
2.5 mg-0.5 mg/3 ml | RESPIRATORY_TRACT | Status: AC
Start: 2016-02-08 — End: 2016-02-08
  Administered 2016-02-08: 05:00:00 via RESPIRATORY_TRACT

## 2016-02-08 MED ORDER — PREDNISONE 20 MG TAB
20 mg | ORAL_TABLET | Freq: Every day | ORAL | 0 refills | Status: AC
Start: 2016-02-08 — End: 2016-02-13

## 2016-02-08 MED ORDER — ALBUTEROL SULFATE HFA 90 MCG/ACTUATION AEROSOL INHALER
90 mcg/actuation | RESPIRATORY_TRACT | 1 refills | Status: DC | PRN
Start: 2016-02-08 — End: 2016-03-03

## 2016-02-08 MED FILL — IPRATROPIUM-ALBUTEROL 2.5 MG-0.5 MG/3 ML NEB SOLUTION: 2.5 mg-0.5 mg/3 ml | RESPIRATORY_TRACT | Qty: 3

## 2016-02-08 MED FILL — DEXAMETHASONE SODIUM PHOSPHATE 10 MG/ML IJ SOLN: 10 mg/mL | INTRAMUSCULAR | Qty: 2

## 2016-02-08 NOTE — ED Notes (Signed)
Patient given copy of dc instructions and two script(s).  Patient verbalized understanding of instructions and script (s).  Patient given a current medication reconciliation form and verbalized understanding of their medications.   Patient verbalized understanding of the importance of discussing medications with  his or her physician or clinic when they follow up.  Patient alert and oriented and in no acute distress.  Pt verbalizes pain scale of 0 out of 10.  Patient discharged home ambulatory without assistance. Wheelchair declined.

## 2016-02-08 NOTE — ED Notes (Signed)
Pt ambulated to and from bathroom on room air. On return to room, room air SpO2 96%. Pt sts feeling much better. Dr. Gweneth Fritter advised.

## 2016-02-08 NOTE — ED Notes (Signed)
Pt sts periodic SOB over last week. Increased SOB tonight. Called EMS.

## 2016-02-08 NOTE — ED Triage Notes (Signed)
Per EMS, short of breath with Hx COPD.

## 2016-02-08 NOTE — ED Provider Notes (Signed)
HPI Comments: Kathy Bush is a 55 y.o. Female, with a pertinent PMHx of asthma, COPD, HTN, bronchitis, and emphysema, who presents via EMS to the [facility] ED c/o SOB every night for 2 weeks worsening tonight. Pt reports she has a h/o emphysema, bronchitis, COPD, and asthma. EMS states the pt was given duoneb, en route. Pt reports an increase in SOB tonight, which had prompted her to call EMS. Pt specifically denies any CP.    Social hx: + Tobacco use, - EtOH use, - Illicit drug use    PCP: PROVIDER UNKNOWN    There are no other complaints, changes or physical findings at this time.      The history is provided by the patient and the EMS personnel. No language interpreter was used.        Past Medical History:   Diagnosis Date   ??? Asthma    ??? Chronic obstructive pulmonary disease ([place])    ??? Hypertension        Past Surgical History:   Procedure Laterality Date   ??? HX ORTHOPAEDIC      back         No family history on file.    Social History     Social History   ??? Marital status: MARRIED     Spouse name: N/A   ??? Number of children: N/A   ??? Years of education: N/A     Occupational History   ??? Not on file.     Social History Main Topics   ??? Smoking status: Current Every Day Smoker     Packs/day: 1.00   ??? Smokeless tobacco: Never Used   ??? Alcohol use No   ??? Drug use: No   ??? Sexual activity: Not on file     Other Topics Concern   ??? Not on file     Social History Narrative         ALLERGIES: Review of patient's allergies indicates no known allergies.    Review of Systems   Constitutional: Negative for chills, diaphoresis, fever and unexpected weight change.   HENT: Negative for congestion and sore throat.    Respiratory: Positive for shortness of breath. Negative for cough.    Cardiovascular: Negative for chest pain.        -DOE   Gastrointestinal: Negative for abdominal pain, diarrhea, nausea and vomiting.   Genitourinary: Negative for dysuria, frequency and vaginal discharge.    Musculoskeletal: Negative for arthralgias and neck pain.   Skin: Negative for rash.        -new lesions   Neurological: Negative for dizziness, syncope and headaches.        -focal weakness       Vitals:    02/08/16 0015 02/08/16 0030 02/08/16 0045 02/08/16 0115   BP: (!) 185/108 (!) 171/105 (!) 165/111 (!) 158/123   Pulse: (!) 116 (!) 107 (!) 108 (!) 107   Resp: (!) 34 27 16 17    Temp:       SpO2: 95% 96% 96% 99%   Weight:       Height:                Physical Exam   Constitutional: She is oriented to person, place, and time. No distress.   HENT:   Head: Normocephalic and atraumatic.   Eyes: Right eye exhibits no discharge. Left eye exhibits no discharge.   No conjunctival redness   Neck: Neck supple. No tracheal deviation present.  Cardiovascular: Normal rate and regular rhythm.  Exam reveals no gallop and no friction rub.    No murmur heard.  Pulmonary/Chest: She has no rales.   Moderate respiratory distress, diminished breath sounds BL, expiratory wheezing   Abdominal: Soft. Bowel sounds are normal. She exhibits no distension. There is no tenderness.   Genitourinary:   Genitourinary Comments: No perineal lesions, no CVA tenderness   Musculoskeletal:   Back: No point tenderness, no bony tenderness, no discoloration or swelling     Lymphadenopathy:     She has no cervical adenopathy.   Neurological: She is alert and oriented to person, place, and time.   No focal weakness, no changes in vision or hearing   Skin: Skin is warm. No lesion and no rash noted. She is not diaphoretic.   Psychiatric: She has a normal mood and affect. Thought content normal.        MDM  Number of Diagnoses or Management Options  Moderate persistent asthma with acute exacerbation:   Diagnosis management comments: DDx: Most likely COPD exacerbation. Possible asthma exacerbation. Possible emphysema.        Amount and/or Complexity of Data Reviewed  Clinical lab tests: ordered and reviewed   Tests in the radiology section of CPT??: ordered and reviewed  Obtain history from someone other than the patient: yes (EMS)  Review and summarize past medical records: yes  Independent visualization of images, tracings, or specimens: yes    Critical Care  Total time providing critical care: 30-74 minutes    Patient Progress  Patient progress: stable    ED Course       Procedures    CRITICAL CARE NOTE :  12:16 AM  IMPENDING DETERIORATION -Respiratory  ASSOCIATED RISK FACTORS - Hypoxia and Dysrhythmia  MANAGEMENT- Bedside Assessment  INTERPRETATION -  Xrays    CASE REVIEW - Family  TREATMENT RESPONSE -Resolved  PERFORMED BY - Self  NOTES   :  I have spent 45 minutes of critical care time involved in lab review, consultations with specialist, family decision- making, bedside attention and documentation. During this entire length of time I was immediately available to the patient .    Grier Rocher, MD      PROGRESS NOTE:  1:27 AM  Pt has been re-evaluated. Pt is feeling much better. Pt was able to ambulate to the bathroom and is 96% on RA.      LABORATORY TESTS:  Recent Results (from the past 12 hour(s))   CBC WITH AUTOMATED DIFF    Collection Time: 02/08/16 12:23 AM   Result Value Ref Range    WBC 7.2 3.6 - 11.0 K/uL    RBC 4.60 3.80 - 5.20 M/uL    HGB 13.9 11.5 - 16.0 g/dL    HCT 41.5 35.0 - 47.0 %    MCV 90.2 80.0 - 99.0 FL    MCH 30.2 26.0 - 34.0 PG    MCHC 33.5 30.0 - 36.5 g/dL    RDW 13.7 11.5 - 14.5 %    PLATELET 226 150 - 400 K/uL    NEUTROPHILS 57 32 - 75 %    LYMPHOCYTES 37 12 - 49 %    MONOCYTES 5 5 - 13 %    EOSINOPHILS 1 0 - 7 %    BASOPHILS 0 0 - 1 %    ABS. NEUTROPHILS 4.1 1.8 - 8.0 K/UL    ABS. LYMPHOCYTES 2.7 0.8 - 3.5 K/UL    ABS. MONOCYTES 0.4 0.0 -  1.0 K/UL    ABS. EOSINOPHILS 0.1 0.0 - 0.4 K/UL    ABS. BASOPHILS 0.0 0.0 - 0.1 K/UL   METABOLIC PANEL, BASIC    Collection Time: 02/08/16 12:23 AM   Result Value Ref Range    Sodium 140 136 - 145 mmol/L    Potassium 3.7 3.5 - 5.1 mmol/L     Chloride 103 97 - 108 mmol/L    CO2 27 21 - 32 mmol/L    Anion gap 10 5 - 15 mmol/L    Glucose 206 (H) 65 - 100 mg/dL    BUN 12 6 - 20 MG/DL    Creatinine 0.99 0.55 - 1.02 MG/DL    BUN/Creatinine ratio 12 12 - 20      GFR est AA >60 >60 ml/min/1.89m2    GFR est non-AA 58 (L) >60 ml/min/1.58m2    Calcium 8.5 8.5 - 10.1 MG/DL       IMAGING RESULTS:  CXR Results  (Last 48 hours)               02/08/16 0035  XR CHEST PORT Final result    Impression:  IMPRESSION: No Acute Disease.           Narrative:  EXAM: Portable CXR.  0030 hours         INDICATION: Short of breath increased tonight, with history of COPD.       The lungs are clear. Heart is normal in size. There is no overt pulmonary edema.   There is no evident pneumothorax, apparent adenopathy or sizable pleural   effusion.                 MEDICATIONS GIVEN:  Medications   dexamethasone (DECADRON) injection 20 mg (20 mg IntraMUSCular Given 02/08/16 0037)   albuterol-ipratropium (DUO-NEB) 2.5 MG-0.5 MG/3 ML (3 mL Nebulization Given 02/08/16 0037)       IMPRESSION:  1. Moderate persistent asthma with acute exacerbation        PLAN:  1. Discharge home  Current Discharge Medication List      START taking these medications    Details   predniSONE (DELTASONE) 20 mg tablet Take 3 Tabs by mouth daily for 5 days.  Qty: 15 Tab, Refills: 0      albuterol (PROVENTIL HFA, VENTOLIN HFA, PROAIR HFA) 90 mcg/actuation inhaler Take 1-2 Puffs by inhalation every four (4) hours as needed for Wheezing.  Qty: 1 Inhaler, Refills: 1           2.   Follow-up Information     Follow up With Details Comments Contact Info    Provider Unknown   Patient not available to ask          Return to ED if worse     DISCHARGE NOTE  1:34 AM  The patient has been re-evaluated and is ready for discharge. Reviewed available results with patient. Counseled patient on diagnosis and care plan. Patient has expressed understanding, and all questions have been  answered. Patient agrees with plan and agrees to follow up as recommended, or return to the ED if their symptoms worsen. Discharge instructions have been provided and explained to the patient, along with reasons to return to the ED.    ATTESTATION:  This note is prepared by Betsey Amen, acting as Scribe for Grier Rocher, MD.    Grier Rocher, MD: The scribe's documentation has been prepared under my direction and personally reviewed by me in its entirety. I confirm  that the note above accurately reflects all work, treatment, procedures, and medical decision making performed by me.

## 2016-02-16 ENCOUNTER — Inpatient Hospital Stay
Admit: 2016-02-16 | Discharge: 2016-02-16 | Disposition: A | Discharge status: Home / Self Care | Payer: MEDICAID | Attending: Emergency Medicine

## 2016-02-16 DIAGNOSIS — B370 Candidal stomatitis: Secondary | ICD-10-CM

## 2016-02-16 LAB — URINALYSIS W/ REFLEX CULTURE
Bacteria: NEGATIVE /hpf
Bilirubin: NEGATIVE
Blood: NEGATIVE
Glucose: >1000 mg/dL — AB
Ketone: NEGATIVE mg/dL
Leukocyte Esterase: NEGATIVE
Nitrites: NEGATIVE
Protein: NEGATIVE mg/dL
Specific gravity: 1.01 (ref 1.003–1.030)
Urobilinogen: 0.2 EU/dL (ref 0.2–1.0)
pH (UA): 6.5 (ref 5.0–8.0)

## 2016-02-16 LAB — STREP AG SCREEN, GROUP A: Group A Strep Ag ID: NEGATIVE

## 2016-02-16 LAB — GLUCOSE, POC: Glucose (POC): 206 mg/dL — ABNORMAL HIGH (ref 65–100)

## 2016-02-16 MED ORDER — FLUTICASONE 200 MCG-VILANTEROL 25 MCG/DOSE BREATH ACTIVATED INHALER
200-25 mcg/dose | Freq: Every day | RESPIRATORY_TRACT | 0 refills | Status: DC
Start: 2016-02-16 — End: 2016-03-16

## 2016-02-16 MED ORDER — FLUCONAZOLE 150 MG TAB
150 mg | ORAL_TABLET | Freq: Every day | ORAL | 0 refills | Status: DC
Start: 2016-02-16 — End: 2016-02-27

## 2016-02-16 MED ORDER — CLOTRIMAZOLE 10 MG TROCHE
10 mg | ORAL_TABLET | Freq: Every day | 0 refills | Status: DC
Start: 2016-02-16 — End: 2016-02-28

## 2016-02-16 NOTE — ED Provider Notes (Signed)
Patient is a 55 y.o. female presenting with sore throat and vaginal itching. The history is provided by the patient.   Sore Throat    This is a new (Pt endorses sore throat, tonguwe irritation, and white patches in mouth x 2 days. DM pt recently on steroids for COPD exacerbation. Denies recent abx.) problem. The current episode started 2 days ago. The problem has been gradually worsening. There has been no fever. Pertinent negatives include no diarrhea, no vomiting, no congestion, no drooling, no ear discharge, no ear pain, no headaches, no plugged ear sensation, no shortness of breath, no stridor, no swollen glands, no trouble swallowing, no stiff neck and no cough. She has had no exposure to strep. She has tried nothing for the symptoms.   Vaginal Itching    This is a new (white, nonodorous discharge and vaginal itching x 3 days sicne discontinuation of steroid. BS this AM was 137.) problem. The current episode started more than 2 days ago. The problem occurs constantly. The problem has not changed since onset.The discharge occurs spontaneously. The discharge was white and cottage cheese like. She is not pregnant (Pt denies sexual intercourse for "years". No concern for STI.). She has not missed her period. Associated symptoms include frequency and genital itching. Pertinent negatives include no anorexia, no diaphoresis, no fever, no abdominal swelling, no abdominal pain, no constipation, no diarrhea, no nausea, no vomiting, no dyspareunia, no dysuria, no genital burning, no genital lesions, no perineal pain, no perineal odor and no painful intercourse. She has tried nothing for the symptoms.        Past Medical History:   Diagnosis Date   ??? Asthma    ??? Chronic obstructive pulmonary disease ([place])    ??? Hypertension        Past Surgical History:   Procedure Laterality Date   ??? HX ORTHOPAEDIC      back         History reviewed. No pertinent family history.    Social History     Social History    ??? Marital status: SINGLE     Spouse name: N/A   ??? Number of children: N/A   ??? Years of education: N/A     Occupational History   ??? Not on file.     Social History Main Topics   ??? Smoking status: Current Every Day Smoker     Packs/day: 1.00   ??? Smokeless tobacco: Never Used   ??? Alcohol use No   ??? Drug use: No   ??? Sexual activity: Not on file     Other Topics Concern   ??? Not on file     Social History Narrative         ALLERGIES: Review of patient's allergies indicates no known allergies.    Review of Systems   Constitutional: Negative for activity change, chills, diaphoresis, fatigue and fever.   HENT: Positive for mouth sores and sore throat. Negative for congestion, drooling, ear discharge, ear pain, postnasal drip, rhinorrhea, sinus pressure, sneezing and trouble swallowing.    Eyes: Negative for pain and visual disturbance.   Respiratory: Negative.  Negative for cough, chest tightness, shortness of breath and stridor.    Cardiovascular: Negative.  Negative for chest pain.   Gastrointestinal: Negative for abdominal pain, anorexia, constipation, diarrhea, nausea and vomiting.   Genitourinary: Positive for frequency and vaginal discharge. Negative for difficulty urinating, dyspareunia, dysuria, flank pain, hematuria, pelvic pain, urgency, vaginal bleeding and vaginal pain.   Musculoskeletal:  Negative.  Negative for back pain.   Skin: Negative.  Negative for rash.   Neurological: Negative.  Negative for headaches.   Psychiatric/Behavioral: Negative.        Vitals:    02/16/16 1136   BP: (!) 155/99   Pulse: 79   Resp: 16   Temp: 98.6 ??F (37 ??C)   SpO2: 95%   Weight: 89.4 kg (197 lb)   Height: 5\' 2"  (1.575 m)            Physical Exam   Constitutional: She is oriented to person, place, and time. She appears well-developed and well-nourished. No distress.   HENT:   Head: Normocephalic and atraumatic.   Right Ear: Hearing, tympanic membrane, external ear and ear canal normal.    Left Ear: Hearing, tympanic membrane, external ear and ear canal normal.   Nose: Nose normal. No mucosal edema or rhinorrhea. Right sinus exhibits no maxillary sinus tenderness and no frontal sinus tenderness. Left sinus exhibits no maxillary sinus tenderness and no frontal sinus tenderness.   Mouth/Throat: Uvula is midline and mucous membranes are normal. No trismus in the jaw. Normal dentition. No dental abscesses, uvula swelling or dental caries. Oropharyngeal exudate and posterior oropharyngeal erythema present. No posterior oropharyngeal edema or tonsillar abscesses.   Moderate thick white patches to dorsal tongue and posterior oropharynx w/ erythema.   Eyes: Conjunctivae and EOM are normal. Pupils are equal, round, and reactive to light.   Neck: Normal range of motion.   Pulmonary/Chest: Effort normal and breath sounds normal. No respiratory distress.   Abdominal: Soft. She exhibits no distension and no mass. There is no tenderness. There is no rebound and no guarding.   Musculoskeletal: Normal range of motion.   Neurological: She is alert and oriented to person, place, and time. No cranial nerve deficit.   Skin: Skin is warm and dry. She is not diaphoretic.   Psychiatric: She has a normal mood and affect. Her behavior is normal. Judgment and thought content normal.   Nursing note and vitals reviewed.       MDM  Number of Diagnoses or Management Options  Oral candidiasis:   Vaginal candidiasis:   Diagnosis management comments: DDx: vaginal candidias, uti  Oral candidias, strep pharyngitis/tonsillitis    LABORATORY TESTS:  Recent Results (from the past 12 hour(s))  -URINALYSIS W/ REFLEX CULTURE  Collection Time: 02/16/16 11:49 AM       Result                                            Value                         Ref Range                       Color                                             YELLOW/STRAW  Appearance                                        CLEAR                         CLEAR                           Specific gravity                                  1.010                         1.003 - 1.030                   pH (UA)                                           6.5                           5.0 - 8.0                       Protein                                           NEGATIVE                      NEG mg/dL                       Glucose                                           >1000 (A)                     NEG mg/dL                       Ketone                                            NEGATIVE                      NEG mg/dL                       Bilirubin                                         NEGATIVE  NEG                             Blood                                             NEGATIVE                      NEG                             Urobilinogen                                      0.2                           0.2 - 1.0 EU/dL                 Nitrites                                          NEGATIVE                      NEG                             Leukocyte Esterase                                NEGATIVE                      NEG                             WBC                                               0-4                           0 - 4 /hpf                      RBC                                               0-5                           0 - 5 /hpf                      Epithelial cells  FEW                           FEW /lpf                        Bacteria                                          NEGATIVE                      NEG /hpf                        UA:UC IF INDICATED                                                              CNI                         CULTURE NOT INDICATED BY UA RESULT  -STREP AG SCREEN, GROUP A  Collection Time: 02/16/16 11:53 AM        Result                                            Value                         Ref Range                       Group A Strep Ag ID                               NEGATIVE                      NEG                        -GLUCOSE, POC  Collection Time: 02/16/16 12:24 PM       Result                                            Value                         Ref Range                       Glucose (POC)                                     206 (H)                       65 -  100 mg/dL                  Performed by                                      Glenda Chroman                                            IMAGING RESULTS:  No orders to display    MEDICATIONS GIVEN:  Medications - No data to display    IMPRESSION:  Oral candidiasis  (primary encounter diagnosis)  Vaginal candidiasis    PLAN:  1. Current Discharge Medication List    START taking these medications    fluconazole (DIFLUCAN) 150 mg tablet  Take 1 Tab by mouth daily. May take 2nd dose in 72 hrs if symptoms persist.  Qty: 2 Tab Refills: 0    clotrimazole (MYCELEX) 10 mg troche  Take 1 Tab by mouth five (5) times daily for 10 days.  Qty: 50 Tab Refills: 0      CONTINUE these medications which have NOT CHANGED    fluticasone-vilanterol (BREO ELLIPTA) 200-25 mcg/dose inhaler  Take 1 Puff by inhalation daily.    empagliflozin (JARDIANCE) 25 mg tablet  Take 25 mg by mouth daily.    albuterol (PROVENTIL HFA, VENTOLIN HFA, PROAIR HFA) 90 mcg/actuation inhaler   Take 1-2 Puffs by inhalation every four (4) hours as needed for Wheezing.  Qty: 1 Inhaler Refills: 1    HYDROcodone-acetaminophen (XODOL) 7.5-300 mg tablet  Take 1 Tab by mouth.    lisinopril (PRINIVIL, ZESTRIL) 20 mg tablet  Take  by mouth daily.    glipiZIDE (GLUCOTROL) 5 mg tablet  Take 5 mg by mouth two (2) times a day.    cloNIDine HCl (CATAPRES) 0.2 mg tablet  Take 0.2 mg by mouth two (2) times a day.    triamcinolone-dimethicone 0.1-5 % ktoc  0.1 mg by Apply Externally route two (2) times a day.   Qty: 15 g Refills: 0    hydrOXYzine HCl (ATARAX) 50 mg tablet  Take 1 Tab by mouth every six (6) hours as needed for Itching.  Qty: 20 Tab Refills: 0        2. Follow-up Information     Follow up With Details Comments [facility] Schedule an appointment as soon   as possible for a visit in 1 week As needed, If symptoms worsen [place]    [facility] Schedule an appointment as soon as possible   for a visit in 1 week As needed, If symptoms worsen [place] [phone]  [phone]    [facility] Schedule an appointment as soon as possible   for a visit in 1 week As needed, If symptoms worsen [place]  [phone]      Return to ED if worse                  Amount and/or Complexity of Data Reviewed  Clinical lab tests: ordered and reviewed    Patient Progress  Patient progress: stable  ED Course       Procedures    12:29 PM  I have discussed with patient their diagnosis, treatment, and follow up plan. The patient agrees to follow up as outlined in discharge paperwork and also to return to the ED with any worsening. Carlis Stable, PA-C

## 2016-02-16 NOTE — ED Notes (Signed)
Patient discharged by provider.  Pt was offered transport to ED exit in a wheelchair, pt declined wheelchair.

## 2016-02-16 NOTE — ED Notes (Signed)
Emergency Department Nursing Plan of Care       The Nursing Plan of Care is developed from the Nursing assessment and Emergency Department Attending provider initial evaluation.  The plan of care may be reviewed in the ???ED Provider note???.    The Plan of Care was developed with the following considerations:   Patient / Family readiness to learn indicated [id number] understanding  Persons(s) to be included in education: patient  Barriers to Learning/Limitations:No    Signed     Maryan Puls, RN    02/16/2016   11:44 AM

## 2016-02-18 LAB — CULTURE, THROAT: Culture result:: NORMAL

## 2016-02-27 ENCOUNTER — Inpatient Hospital Stay
Admit: 2016-02-27 | Discharge: 2016-02-28 | Disposition: A | Discharge status: Home / Self Care | Payer: MEDICAID | Attending: Emergency Medicine

## 2016-02-27 ENCOUNTER — Emergency Department: Admit: 2016-02-27 | Payer: MEDICAID | Primary: Family

## 2016-02-27 DIAGNOSIS — J441 Chronic obstructive pulmonary disease with (acute) exacerbation: Secondary | ICD-10-CM

## 2016-02-27 LAB — CBC WITH AUTOMATED DIFF
ABS. BASOPHILS: 0 10*3/uL (ref 0.0–0.1)
ABS. EOSINOPHILS: 0.1 10*3/uL (ref 0.0–0.4)
ABS. LYMPHOCYTES: 1.6 10*3/uL (ref 0.8–3.5)
ABS. MONOCYTES: 0.4 10*3/uL (ref 0.0–1.0)
ABS. NEUTROPHILS: 1.4 10*3/uL — ABNORMAL LOW (ref 1.8–8.0)
BASOPHILS: 0 % (ref 0–1)
EOSINOPHILS: 2 % (ref 0–7)
HCT: 42 % (ref 35.0–47.0)
HGB: 13.4 g/dL (ref 11.5–16.0)
LYMPHOCYTES: 46 % (ref 12–49)
MCH: 28.9 PG (ref 26.0–34.0)
MCHC: 31.9 g/dL (ref 30.0–36.5)
MCV: 90.5 FL (ref 80.0–99.0)
MONOCYTES: 11 % (ref 5–13)
NEUTROPHILS: 41 % (ref 32–75)
PLATELET: 215 10*3/uL (ref 150–400)
RBC: 4.64 M/uL (ref 3.80–5.20)
RDW: 14.3 % (ref 11.5–14.5)
WBC: 3.4 10*3/uL — ABNORMAL LOW (ref 3.6–11.0)

## 2016-02-27 LAB — METABOLIC PANEL, COMPREHENSIVE
A-G Ratio: 0.9 — ABNORMAL LOW (ref 1.1–2.2)
ALT (SGPT): 20 U/L (ref 12–78)
AST (SGOT): 15 U/L (ref 15–37)
Albumin: 3.4 g/dL — ABNORMAL LOW (ref 3.5–5.0)
Alk. phosphatase: 89 U/L (ref 45–117)
Anion gap: 11 mmol/L (ref 5–15)
BUN/Creatinine ratio: 13 (ref 12–20)
BUN: 13 MG/DL (ref 6–20)
Bilirubin, total: 0.3 MG/DL (ref 0.2–1.0)
CO2: 26 mmol/L (ref 21–32)
Calcium: 8.4 MG/DL — ABNORMAL LOW (ref 8.5–10.1)
Chloride: 102 mmol/L (ref 97–108)
Creatinine: 0.98 MG/DL (ref 0.55–1.02)
GFR est AA: >60 mL/min/{1.73_m2} (ref >60)
GFR est non-AA: 59 mL/min/{1.73_m2} — ABNORMAL LOW (ref >60)
Globulin: 3.8 g/dL (ref 2.0–4.0)
Glucose: 221 mg/dL — ABNORMAL HIGH (ref 65–100)
Potassium: 3.5 mmol/L (ref 3.5–5.1)
Protein, total: 7.2 g/dL (ref 6.4–8.2)
Sodium: 139 mmol/L (ref 136–145)

## 2016-02-27 LAB — TROPONIN I: Troponin-I, Qt.: 0.04 ng/mL (ref <0.05)

## 2016-02-27 LAB — CK W/ CKMB & INDEX
CK - MB: 1.1 NG/ML (ref <3.6)
CK-MB Index: 0.6 (ref 0–2.5)
CK: 199 U/L — ABNORMAL HIGH (ref 26–192)

## 2016-02-27 LAB — MAGNESIUM: Magnesium: 2.1 mg/dL (ref 1.6–2.4)

## 2016-02-27 MED ORDER — IPRATROPIUM-ALBUTEROL 2.5 MG-0.5 MG/3 ML NEB SOLUTION
2.5 mg-0.5 mg/3 ml | RESPIRATORY_TRACT | Status: AC
Start: 2016-02-27 — End: 2016-02-27
  Administered 2016-02-27: 20:00:00 via RESPIRATORY_TRACT

## 2016-02-27 MED ORDER — ALBUTEROL SULFATE 0.083 % (0.83 MG/ML) SOLN FOR INHALATION
2.5 mg /3 mL (0.083 %) | RESPIRATORY_TRACT | Status: AC
Start: 2016-02-27 — End: 2016-02-27
  Administered 2016-02-27: 22:00:00 via RESPIRATORY_TRACT

## 2016-02-27 MED ORDER — IPRATROPIUM-ALBUTEROL 2.5 MG-0.5 MG/3 ML NEB SOLUTION
2.5 mg-0.5 mg/3 ml | RESPIRATORY_TRACT | Status: AC
Start: 2016-02-27 — End: 2016-02-27
  Administered 2016-02-27: 21:00:00 via RESPIRATORY_TRACT

## 2016-02-27 MED ORDER — SODIUM CHLORIDE 0.9 % IJ SYRG
Freq: Three times a day (TID) | INTRAMUSCULAR | Status: DC
Start: 2016-02-27 — End: 2016-02-28
  Administered 2016-02-27 – 2016-02-28 (×3): via INTRAVENOUS

## 2016-02-27 MED ORDER — DEXAMETHASONE SODIUM PHOSPHATE 10 MG/ML IJ SOLN
10 mg/mL | Freq: Once | INTRAMUSCULAR | Status: AC
Start: 2016-02-27 — End: 2016-02-27
  Administered 2016-02-27: 21:00:00 via INTRAVENOUS

## 2016-02-27 MED ORDER — SODIUM CHLORIDE 0.9 % IJ SYRG
INTRAMUSCULAR | Status: DC | PRN
Start: 2016-02-27 — End: 2016-02-28

## 2016-02-27 MED FILL — IPRATROPIUM-ALBUTEROL 2.5 MG-0.5 MG/3 ML NEB SOLUTION: 2.5 mg-0.5 mg/3 ml | RESPIRATORY_TRACT | Qty: 3

## 2016-02-27 MED FILL — ALBUTEROL SULFATE 0.083 % (0.83 MG/ML) SOLN FOR INHALATION: 2.5 mg /3 mL (0.083 %) | RESPIRATORY_TRACT | Qty: 2

## 2016-02-27 MED FILL — DEXAMETHASONE SODIUM PHOSPHATE 10 MG/ML IJ SOLN: 10 mg/mL | INTRAMUSCULAR | Qty: 1

## 2016-02-27 NOTE — Progress Notes (Addendum)
Tigertexted Dr. Perry Mount that the pt's sugar of 311 and  p does not have a sliding scale.  Dr. Perry Mount called and ordered a sliding scale for the pt.      2300 - Informed pt that Dr. Perry Mount ordered insulin for her sugar of 311.  Pt said she is not taking any insulin.  Pt verbalized she has a fear of needles.  Pt verbalized she takes her jardiance when her sugar is high and that this brings her sugar down.  Pt states she does not take her jardiance everyday as prescribed because if she did her sugar would drop too low.  Pt states she will go into a diabetic coma before she will take insulin and does not understand why the doctor will not give her the medicine she takes at home.  Pt says she knows her body and what works best for her.      2353  Tigertexted Dr. Perry Mount that the pt  refused her insulin.  Pt states when her sugar is high she takes her jardiance.  Pt does not take her jardiance daily as prescribed.  Dr. Perry Mount called and said if she is refusing there is nothing we can do and to document the pt's refusal.    9 - Tigertexted Dr. Geoffry Paradise that the pt  had a bp=194/115 and pulse=99 at 0240.  At 2240 bp=198/95 and pulse=86.  Pt received clonidine 1mg  at 2300.  Dr. Geoffry Paradise ordered hydralazine 10mg  Q4 prn for bp >160/90.    0543 - Tigertexted Dr. Geoffry Paradise that the pt's IV infiltrated and she is refusing to have another IV put in.  Explained to pt that she needs an IV due to having IV steroids, IV blood pressure medicine, and in case of an emergency.  Pt is still refusing to have an IV put in.  Also, pt's bp=200/105 and pulse=91. Dr. Geoffry Paradise discontinued the pt's IV solumedrol and ordered prednisone 40mg  PO daily (with a dose now) and to give the pt her lisinipril early, instead of at 0900.

## 2016-02-27 NOTE — Progress Notes (Signed)
TRANSFER - IN REPORT:    Verbal report received from Sharlene Dory, RN (name) on Kathy Bush  being received from ER (unit) for routine progression of care      Report consisted of patient???s Situation, Background, Assessment and   Recommendations(SBAR).     Information from the following report(s) SBAR, Kardex, ED Summary, [facility] and Recent Results was reviewed with the receiving nurse.

## 2016-02-27 NOTE — H&P (Signed)
History & Physical Dictation Template      NAME: Kathy Bush   Date of Service  02/27/2016   MRN  [id number]   Date of Birth Jan 24, 1961   AGE: 55 y.o.   ROOM: 214/01     '@HACCTNO'$ @    '@HAREXTID'$ @     '@HARGUARSSN'$ @     HPI  The patient Kathy Bush is a 55 y.o. female that is admitted with Acute copd exacerbation in a patient with continued tobacco use.  Patient was seen approx 3 weeeks ago in ER and sent home after nebulizer therapy and steroids for home use.  She re-presents with Shortness of breath for past few days, audible wheezing.  She was given x 3 duoneb treatments and decadron in Er and yet still with wheezing.  CXR was with no acute distress.  She is on Room air yet ER physician felt that she should be watched overnight  [place] RN PRESENT.    ASSESSMENT/ PLAN   1. Acute copd exacerbation.  Continue scheduled duonebulizer therapy.  Solumedrol q6hrs with hopes of swapping to oral prednisone in AM.  No need for antibiotics currently as no infiltrate on CXR and normal wbc ct and no fevers.  No productive sputum.    2. Htn.  Continue patient on clonidine and lisinopril.  3. DMtype 2.  Patient is not wanting insulin coverage.  Will continue her on her glipizide.  She is also on jardiance at home.  Follow accuchecks.   4. Patient is ambulatory for dvt prophylaxis.  Principal Problem:    COPD with acute exacerbation ([place]) (02/27/2016)           Chart reviewed   Pt seen and examined   Please see full dictation for additional        BMI: Body mass index is 36.21 kg/(m^2) (pended).  Visit Vitals   ??? BP (!) (P) 185/92 (BP 1 Location: Left arm, BP Patient Position: At rest;Supine)   ??? Pulse (P) 77   ??? Temp (P) 98.1 ??F (36.7 ??C)   ??? Resp (!) 31   ??? Ht '5\' 2"'$  (1.575 m)   ??? Wt (P) 89.8 kg (198 lb)   ??? SpO2 (P) 96%   ??? BMI (P) 36.21 kg/m2       Pertinent Physical Exam Findings: audible expiratory wheezes.    ________________________________________________________________________  family history is not on file.  ________________________________________________________________________  Present on Admission:  ??? COPD with acute exacerbation ([place])    ________________________________________________________________________  ________________________________________________________________________  Recent Results (from the past 24 hour(s))   CBC WITH AUTOMATED DIFF    Collection Time: 02/27/16  4:33 PM   Result Value Ref Range    WBC 3.4 (L) 3.6 - 11.0 K/uL    RBC 4.64 3.80 - 5.20 M/uL    HGB 13.4 11.5 - 16.0 g/dL    HCT 42.0 35.0 - 47.0 %    MCV 90.5 80.0 - 99.0 FL    MCH 28.9 26.0 - 34.0 PG    MCHC 31.9 30.0 - 36.5 g/dL    RDW 14.3 11.5 - 14.5 %    PLATELET 215 150 - 400 K/uL    NEUTROPHILS 41 32 - 75 %    LYMPHOCYTES 46 12 - 49 %    MONOCYTES 11 5 - 13 %    EOSINOPHILS 2 0 - 7 %    BASOPHILS 0 0 - 1 %    ABS. NEUTROPHILS 1.4 (L)  1.8 - 8.0 K/UL    ABS. LYMPHOCYTES 1.6 0.8 - 3.5 K/UL    ABS. MONOCYTES 0.4 0.0 - 1.0 K/UL    ABS. EOSINOPHILS 0.1 0.0 - 0.4 K/UL    ABS. BASOPHILS 0.0 0.0 - 0.1 K/UL   METABOLIC PANEL, COMPREHENSIVE    Collection Time: 02/27/16  4:33 PM   Result Value Ref Range    Sodium 139 136 - 145 mmol/L    Potassium 3.5 3.5 - 5.1 mmol/L    Chloride 102 97 - 108 mmol/L    CO2 26 21 - 32 mmol/L    Anion gap 11 5 - 15 mmol/L    Glucose 221 (H) 65 - 100 mg/dL    BUN 13 6 - 20 MG/DL    Creatinine 0.98 0.55 - 1.02 MG/DL    BUN/Creatinine ratio 13 12 - 20      GFR est AA >60 >60 ml/min/1.1m    GFR est non-AA 59 (L) >60 ml/min/1.761m   Calcium 8.4 (L) 8.5 - 10.1 MG/DL    Bilirubin, total 0.3 0.2 - 1.0 MG/DL    ALT (SGPT) 20 12 - 78 U/L    AST (SGOT) 15 15 - 37 U/L    Alk. phosphatase 89 45 - 117 U/L    Protein, total 7.2 6.4 - 8.2 g/dL    Albumin 3.4 (L) 3.5 - 5.0 g/dL    Globulin 3.8 2.0 - 4.0 g/dL    A-G Ratio 0.9 (L) 1.1 - 2.2     TROPONIN I    Collection Time: 02/27/16  4:33 PM   Result Value Ref Range     Troponin-I, Qt. 0.04 <0.05 ng/mL   CK W/ CKMB & INDEX    Collection Time: 02/27/16  4:33 PM   Result Value Ref Range    CK 199 (H) 26 - 192 U/L    CK - MB 1.1 <3.6 NG/ML    CK-MB Index 0.6 0 - 2.5     MAGNESIUM    Collection Time: 02/27/16  4:33 PM   Result Value Ref Range    Magnesium 2.1 1.6 - 2.4 mg/dL     ________________________________________________________________________  Medications reviewed  Current Facility-Administered Medications   Medication Dose Route Frequency   ??? sodium chloride (NS) flush 5-10 mL  5-10 mL IntraVENous Q8H   ??? sodium chloride (NS) flush 5-10 mL  5-10 mL IntraVENous PRN   ??? sodium chloride (NS) flush 5-10 mL  5-10 mL IntraVENous Q8H   ??? sodium chloride (NS) flush 5-10 mL  5-10 mL IntraVENous PRN   ??? albuterol (PROVENTIL VENTOLIN) nebulizer solution 2.5 mg  2.5 mg Nebulization Q6H RT   ??? ipratropium (ATROVENT) 0.02 % nebulizer solution 0.5 mg  0.5 mg Nebulization Q6H RT   ??? [START ON 02/28/2016] methylPREDNISolone (PF) (SOLU-MEDROL) injection 60 mg  60 mg IntraVENous Q6H   ??? acetaminophen (TYLENOL) tablet 650 mg  650 mg Oral Q4H PRN   ??? HYDROcodone-acetaminophen (NORCO) 5-325 mg per tablet 1 Tab  1 Tab Oral Q4H PRN   ??? ondansetron (ZOFRAN) injection 4 mg  4 mg IntraVENous Q6H PRN   ??? [START ON 02/28/2016] lisinopril (PRINIVIL, ZESTRIL) tablet 20 mg  20 mg Oral DAILY   ??? [START ON 02/28/2016] glipiZIDE (GLUCOTROL) tablet 5 mg  5 mg Oral ACB&D   ??? [START ON 02/28/2016] cloNIDine HCl (CATAPRES) tablet 0.2 mg  0.2 mg Oral BID       AnMauri PoleMD  02/27/2016  9:34 PM

## 2016-02-27 NOTE — ED Notes (Signed)
Patient presents with SOB for several days.  Lung sounds coarse throughout all lung fields.  Inspiratory and expiratory wheezing.    Frequent, congested cough.  Oxygen sats 97-98% on RA.   Patient states she doesn't want to be admitted and that if she is admitted she can only stay until Sunday.  NP informed.       Emergency Department Nursing Plan of Care       The Nursing Plan of Care is developed from the Nursing assessment and Emergency Department Attending provider initial evaluation.  The plan of care may be reviewed in the ???ED Provider note???.    The Plan of Care was developed with the following considerations:   Patient / Family readiness to learn indicated [id number] understanding  Persons(s) to be included in education: patient  Barriers to Learning/Limitations:No    Signed     Neva Seat, RN    02/27/2016   6:46 PM

## 2016-02-27 NOTE — ED Notes (Signed)
Pt continues to rest quietly in bed in position of comfort.  Updated on plan of care. Call bell within reach.

## 2016-02-27 NOTE — ED Notes (Signed)
Bedside and Verbal shift change report given to me (oncoming nurse) by Angie Fava, RN  (offgoing nurse). Report included the following information SBAR, Kardex, ED Summary and Recent Results.

## 2016-02-27 NOTE — ED Notes (Signed)
Attempted to give report, RN will call back.

## 2016-02-27 NOTE — ED Notes (Signed)
TRANSFER - OUT REPORT:    Verbal report given to Colletta [place], RN (name) on Kathy Bush  being transferred to [facility](unit) for routine progression of care       Report consisted of patient???s Situation, Background, Assessment and   Recommendations(SBAR).     Information from the following report(s) SBAR, Kardex, ED Summary and Recent Results was reviewed with the receiving nurse.    Lines:   Peripheral IV 02/27/16 Right Antecubital (Active)   Site Assessment Clean, dry, & intact 02/27/2016  4:31 PM   Phlebitis Assessment 0 02/27/2016  4:31 PM   Infiltration Assessment 0 02/27/2016  4:31 PM   Dressing Status Clean, dry, & intact 02/27/2016  4:31 PM   Dressing Type Transparent 02/27/2016  4:31 PM   Hub Color/Line Status Patent;Flushed;Pink 02/27/2016  4:31 PM   Action Taken Blood drawn 02/27/2016  4:31 PM        Opportunity for questions and clarification was provided.      Patient transported with:   Monitor  Registered Nurse

## 2016-02-27 NOTE — ED Notes (Signed)
Pt states "I am going to pass out if I don't get something to eat." Provided pt with bag lunch and diet ginger ale, per provider.

## 2016-02-27 NOTE — ED Notes (Signed)
Change of shift handoff to [place], [place]

## 2016-02-27 NOTE — ED Provider Notes (Signed)
Patient is a 55 y.o. female presenting with shortness of breath. The history is provided by the patient. No language interpreter was used.   Shortness of Breath   This is a recurrent problem. The problem occurs intermittently.The current episode started 2 days ago. The problem has been gradually worsening. Associated symptoms include cough, sputum production and wheezing. Pertinent negatives include no fever, no headaches, no coryza, no sore throat, no neck pain, no chest pain, no abdominal pain, no rash and no leg swelling. Risk factors: HV asthma. She has tried beta-agonist inhalers for the symptoms. She has had prior hospitalizations. She has had prior ED visits. She has had no prior ICU admissions. Associated medical issues include asthma.        Past Medical History:   Diagnosis Date   ??? Asthma    ??? Chronic obstructive pulmonary disease ([place])    ??? Hypertension        Past Surgical History:   Procedure Laterality Date   ??? HX ORTHOPAEDIC      back         No family history on file.    Social History     Social History   ??? Marital status: SINGLE     Spouse name: N/A   ??? Number of children: N/A   ??? Years of education: N/A     Occupational History   ??? Not on file.     Social History Main Topics   ??? Smoking status: Current Every Day Smoker     Packs/day: 1.00   ??? Smokeless tobacco: Never Used   ??? Alcohol use No   ??? Drug use: No   ??? Sexual activity: Not on file     Other Topics Concern   ??? Not on file     Social History Narrative         ALLERGIES: Review of patient's allergies indicates no known allergies.    Review of Systems   Constitutional: Negative for fatigue and fever.   HENT: Negative for sore throat.    Respiratory: Positive for cough, sputum production, shortness of breath and wheezing.    Cardiovascular: Negative for chest pain, palpitations and leg swelling.   Gastrointestinal: Negative for abdominal pain.   Musculoskeletal: Negative for arthralgias, myalgias, neck pain and neck stiffness.    Skin: Negative for pallor and rash.   Neurological: Negative for dizziness, tremors, weakness and headaches.   Hematological: Negative for adenopathy.   Psychiatric/Behavioral: Negative for agitation and behavioral problems.   All other systems reviewed and are negative.      Vitals:    02/27/16 1842 02/27/16 1903 02/27/16 2028 02/27/16 2030   BP:  160/86 162/68    Pulse:  90 89    Resp:  21 (!) 31    Temp: 98.6 ??F (37 ??C)   98.5 ??F (36.9 ??C)   SpO2:  93% 94%    Weight:       Height:                Physical Exam   Constitutional: She is oriented to person, place, and time. She appears well-developed and well-nourished. No distress.   HENT:   Head: Normocephalic and atraumatic.   Right Ear: External ear normal.   Left Ear: External ear normal.   Nose: Nose normal.   Mouth/Throat: Oropharynx is clear and moist.   Eyes: Conjunctivae are normal.   Neck: Normal range of motion. Neck supple.   Cardiovascular: Normal rate, regular rhythm  and normal heart sounds.    Pulmonary/Chest: Effort normal and breath sounds normal. No respiratory distress. She has no wheezes.   Coarse scattered wheezes throughout lung fields   Abdominal: Soft. Bowel sounds are normal. There is no tenderness.   Musculoskeletal: Normal range of motion.   Lymphadenopathy:     She has no cervical adenopathy.   Neurological: She is alert and oriented to person, place, and time. No cranial nerve deficit. Coordination normal.   Skin: Skin is warm and dry. No rash noted.   Psychiatric: She has a normal mood and affect. Her behavior is normal. Judgment and thought content normal.   Nursing note and vitals reviewed.       MDM  Number of Diagnoses or Management Options  Asthma with acute exacerbation, unspecified asthma severity:   Diagnosis management comments: DDX bronchitis pneumonia asthma exacerbation       Amount and/or Complexity of Data Reviewed  Clinical lab tests: ordered and reviewed  Review and summarize past medical records: yes   Discuss the patient with other providers: yes    Patient Progress  Patient progress: stable    ED Course       Procedures    Patient re evaluated. Wheezes persist lower lobes    Patient is being admitted to the hospital.  The results of their tests and reasons for their admission have been discussed with them and/or available family.  They convey agreement and understanding for the need to be admitted and for their admission diagnosis.  Consultation has been made with the inpatient physician specialist for hospitalization.    LABORATORY TESTS:  Recent Results (from the past 12 hour(s))   CBC WITH AUTOMATED DIFF    Collection Time: 02/27/16  4:33 PM   Result Value Ref Range    WBC 3.4 (L) 3.6 - 11.0 K/uL    RBC 4.64 3.80 - 5.20 M/uL    HGB 13.4 11.5 - 16.0 g/dL    HCT 42.0 35.0 - 47.0 %    MCV 90.5 80.0 - 99.0 FL    MCH 28.9 26.0 - 34.0 PG    MCHC 31.9 30.0 - 36.5 g/dL    RDW 14.3 11.5 - 14.5 %    PLATELET 215 150 - 400 K/uL    NEUTROPHILS 41 32 - 75 %    LYMPHOCYTES 46 12 - 49 %    MONOCYTES 11 5 - 13 %    EOSINOPHILS 2 0 - 7 %    BASOPHILS 0 0 - 1 %    ABS. NEUTROPHILS 1.4 (L) 1.8 - 8.0 K/UL    ABS. LYMPHOCYTES 1.6 0.8 - 3.5 K/UL    ABS. MONOCYTES 0.4 0.0 - 1.0 K/UL    ABS. EOSINOPHILS 0.1 0.0 - 0.4 K/UL    ABS. BASOPHILS 0.0 0.0 - 0.1 K/UL   METABOLIC PANEL, COMPREHENSIVE    Collection Time: 02/27/16  4:33 PM   Result Value Ref Range    Sodium 139 136 - 145 mmol/L    Potassium 3.5 3.5 - 5.1 mmol/L    Chloride 102 97 - 108 mmol/L    CO2 26 21 - 32 mmol/L    Anion gap 11 5 - 15 mmol/L    Glucose 221 (H) 65 - 100 mg/dL    BUN 13 6 - 20 MG/DL    Creatinine 0.98 0.55 - 1.02 MG/DL    BUN/Creatinine ratio 13 12 - 20      GFR est AA >60 >60 ml/min/1.23m  GFR est non-AA 59 (L) >60 ml/min/1.12m    Calcium 8.4 (L) 8.5 - 10.1 MG/DL    Bilirubin, total 0.3 0.2 - 1.0 MG/DL    ALT (SGPT) 20 12 - 78 U/L    AST (SGOT) 15 15 - 37 U/L    Alk. phosphatase 89 45 - 117 U/L    Protein, total 7.2 6.4 - 8.2 g/dL     Albumin 3.4 (L) 3.5 - 5.0 g/dL    Globulin 3.8 2.0 - 4.0 g/dL    A-G Ratio 0.9 (L) 1.1 - 2.2     TROPONIN I    Collection Time: 02/27/16  4:33 PM   Result Value Ref Range    Troponin-I, Qt. 0.04 <0.05 ng/mL   CK W/ CKMB & INDEX    Collection Time: 02/27/16  4:33 PM   Result Value Ref Range    CK 199 (H) 26 - 192 U/L    CK - MB 1.1 <3.6 NG/ML    CK-MB Index 0.6 0 - 2.5     MAGNESIUM    Collection Time: 02/27/16  4:33 PM   Result Value Ref Range    Magnesium 2.1 1.6 - 2.4 mg/dL       IMAGING RESULTS:  XR CHEST PORT   Final Result        Xr Chest Port    Result Date: 02/27/2016  EXAM:  XR CHEST PORT INDICATION:  chest pain COMPARISON:  February 08, 2016 FINDINGS: A portable AP radiograph of the chest was obtained at 1805 hours. There is no pneumothorax or pleural effusion.  The lungs are clear.  The cardiac and mediastinal contours and pulmonary vascularity are normal.  The bones and soft tissues are grossly within normal limits.     IMPRESSION: No acute findings.       MEDICATIONS GIVEN:  Medications   sodium chloride (NS) flush 5-10 mL (10 mL IntraVENous Given 02/27/16 1654)   sodium chloride (NS) flush 5-10 mL (not administered)   albuterol-ipratropium (DUO-NEB) 2.5 MG-0.5 MG/3 ML (3 mL Nebulization Given 02/27/16 1621)   dexamethasone (DECADRON) injection 10 mg (10 mg IntraVENous Given 02/27/16 1651)   albuterol-ipratropium (DUO-NEB) 2.5 MG-0.5 MG/3 ML (3 mL Nebulization Given 02/27/16 1710)   albuterol (PROVENTIL VENTOLIN) nebulizer solution 5 mg (5 mg Nebulization Given 02/27/16 1814)       IMPRESSION:  1. Asthma with acute exacerbation, unspecified asthma severity        PLAN:  1. Admit to Dr SPerry Mounthospitalist

## 2016-02-27 NOTE — Progress Notes (Signed)
[facility] Prior to Admission Med List Pharmacy Medication Reconciliation     Recommendations/Findings:   1) PTA list clarified as below  2) Patient states she is on Xanax 2 mg po qhs. I could not confirm this with the pharmacies she usually uses.    Information obtained from: patient interview, RX Query    Past Medical History/Disease States:  Past Medical History:   Diagnosis Date   ??? Asthma    ??? Chronic obstructive pulmonary disease ([place])    ??? Hypertension      Patient allergies:   Allergies as of 02/27/2016   ??? (No Known Allergies)       Prior to Admission Medications   Prescriptions Last Dose Informant Patient Reported? Taking?   HYDROcodone-acetaminophen (XODOL) 7.5-300 mg tablet 02/20/2016 at Unknown time  Yes Yes   Sig: Take 1 Tab by mouth.   albuterol (PROVENTIL HFA, VENTOLIN HFA, PROAIR HFA) 90 mcg/actuation inhaler 02/27/2016 at Unknown time  No Yes   Sig: Take 1-2 Puffs by inhalation every four (4) hours as needed for Wheezing.   amLODIPine (NORVASC) 5 mg tablet 02/27/2016 at Unknown time  Yes Yes   Sig: Take 5 mg by mouth daily.   buPROPion XL (WELLBUTRIN XL) 300 mg XL tablet 02/27/2016 at Unknown time  Yes Yes   Sig: Take 300 mg by mouth daily.   cloNIDine HCl (CATAPRES) 0.1 mg tablet 02/26/2016 at Unknown time  Yes Yes   Sig: Take 0.1 mg by mouth nightly.   clotrimazole (MYCELEX) 10 mg troche   No No   Sig: Take 1 Tab by mouth five (5) times daily for 10 days.   empagliflozin (JARDIANCE) 25 mg tablet 02/27/2016 at Unknown time  Yes Yes   Sig: Take 25 mg by mouth daily.   fluticasone-vilanterol (BREO ELLIPTA) 200-25 mcg/dose inhaler 02/27/2016 at Unknown time  No Yes   Sig: Take 1 Puff by inhalation daily.   glipiZIDE (GLUCOTROL) 5 mg tablet 02/27/2016 at Unknown time  Yes Yes   Sig: Take 5 mg by mouth daily.   hydrOXYzine HCl (ATARAX) 50 mg tablet 02/20/2016 at Unknown time  No Yes   Sig: Take 1 Tab by mouth every six (6) hours as needed for Itching.    hydrocortisone (CORTAID) 1 % topical cream 01/28/2016 at Unknown time  Yes Yes   Sig: Apply  to affected area daily as needed for Skin Irritation or Itching (for itchy face). use thin layer   ibuprofen (MOTRIN) 800 mg tablet 01/28/2016 at Unknown time  Yes Yes   Sig: Take 800 mg by mouth every eight (8) hours as needed for Pain.   lisinopril (PRINIVIL, ZESTRIL) 20 mg tablet 02/27/2016 at Unknown time  Yes Yes   Sig: Take 20 mg by mouth daily.         Thank you,  Kerry Dory, [facility]

## 2016-02-28 LAB — GLUCOSE, POC
Glucose (POC): 311 mg/dL — ABNORMAL HIGH (ref 65–100)
Glucose (POC): 246 mg/dL — ABNORMAL HIGH (ref 65–100)

## 2016-02-28 MED ORDER — DEXTROSE 50% IN WATER (D50W) IV SYRG
INTRAVENOUS | Status: DC | PRN
Start: 2016-02-28 — End: 2016-02-28

## 2016-02-28 MED ORDER — GLUCAGON 1 MG INJECTION
1 mg | INTRAMUSCULAR | Status: DC | PRN
Start: 2016-02-28 — End: 2016-02-28

## 2016-02-28 MED ORDER — PREDNISONE 20 MG TAB
20 mg | Freq: Every day | ORAL | Status: DC
Start: 2016-02-28 — End: 2016-02-28

## 2016-02-28 MED ORDER — IPRATROPIUM-ALBUTEROL 2.5 MG-0.5 MG/3 ML NEB SOLUTION
2.5 mg-0.5 mg/3 ml | Freq: Four times a day (QID) | RESPIRATORY_TRACT | Status: DC
Start: 2016-02-28 — End: 2016-02-28
  Administered 2016-02-28: 12:00:00 via RESPIRATORY_TRACT

## 2016-02-28 MED ORDER — PREDNISONE 20 MG TAB
20 mg | ORAL_TABLET | Freq: Every day | ORAL | 0 refills | Status: DC
Start: 2016-02-28 — End: 2016-03-03

## 2016-02-28 MED ORDER — LISINOPRIL 20 MG TAB
20 mg | Freq: Every day | ORAL | Status: DC
Start: 2016-02-28 — End: 2016-02-28
  Administered 2016-02-28: 10:00:00 via ORAL

## 2016-02-28 MED ORDER — DEXTROMETHORPHAN-GUAIFENESIN 10 MG-100 MG/5 ML ORAL LIQUID
10-100 mg/5 mL | ORAL | 0 refills | Status: DC | PRN
Start: 2016-02-28 — End: 2016-03-16

## 2016-02-28 MED ORDER — PREDNISONE 20 MG TAB
20 mg | Freq: Once | ORAL | Status: AC
Start: 2016-02-28 — End: 2016-02-28
  Administered 2016-02-28: 10:00:00 via ORAL

## 2016-02-28 MED ORDER — ONDANSETRON (PF) 4 MG/2 ML INJECTION
4 mg/2 mL | Freq: Four times a day (QID) | INTRAMUSCULAR | Status: DC | PRN
Start: 2016-02-28 — End: 2016-02-28

## 2016-02-28 MED ORDER — HYDRALAZINE 20 MG/ML IJ SOLN
20 mg/mL | INTRAMUSCULAR | Status: DC | PRN
Start: 2016-02-28 — End: 2016-02-28
  Administered 2016-02-28: 07:00:00 via INTRAVENOUS

## 2016-02-28 MED ORDER — SODIUM CHLORIDE 0.9 % IJ SYRG
Freq: Three times a day (TID) | INTRAMUSCULAR | Status: DC
Start: 2016-02-28 — End: 2016-02-28
  Administered 2016-02-28 (×2): via INTRAVENOUS

## 2016-02-28 MED ORDER — ACETAMINOPHEN 325 MG TABLET
325 mg | ORAL | Status: DC | PRN
Start: 2016-02-28 — End: 2016-02-28

## 2016-02-28 MED ORDER — METHYLPREDNISOLONE (PF) 125 MG/2 ML IJ SOLR
125 mg/2 mL | Freq: Four times a day (QID) | INTRAMUSCULAR | Status: DC
Start: 2016-02-28 — End: 2016-02-28
  Administered 2016-02-28: 03:00:00 via INTRAVENOUS

## 2016-02-28 MED ORDER — HYDROCODONE-ACETAMINOPHEN 5 MG-325 MG TAB
5-325 mg | ORAL | Status: DC | PRN
Start: 2016-02-28 — End: 2016-02-28
  Administered 2016-02-28: 13:00:00 via ORAL

## 2016-02-28 MED ORDER — HYDROCORTISONE 1 % TOPICAL CREAM
1 % | CUTANEOUS | 0 refills | Status: AC
Start: 2016-02-28 — End: ?

## 2016-02-28 MED ORDER — GLUCOSE 4 GRAM CHEWABLE TAB
4 gram | ORAL | Status: DC | PRN
Start: 2016-02-28 — End: 2016-02-28

## 2016-02-28 MED ORDER — CLONIDINE 0.1 MG TAB
0.1 mg | Freq: Every evening | ORAL | Status: DC
Start: 2016-02-28 — End: 2016-02-28
  Administered 2016-02-28: 03:00:00 via ORAL

## 2016-02-28 MED ORDER — INSULIN LISPRO 100 UNIT/ML INJECTION
100 unit/mL | Freq: Four times a day (QID) | SUBCUTANEOUS | Status: DC
Start: 2016-02-28 — End: 2016-02-27

## 2016-02-28 MED ORDER — GLIPIZIDE 5 MG TAB
5 mg | Freq: Every day | ORAL | Status: DC
Start: 2016-02-28 — End: 2016-02-28
  Administered 2016-02-28: 13:00:00 via ORAL

## 2016-02-28 MED ORDER — IPRATROPIUM BROMIDE 0.02 % SOLN FOR INHALATION
0.02 % | Freq: Four times a day (QID) | RESPIRATORY_TRACT | Status: DC
Start: 2016-02-28 — End: 2016-02-28
  Administered 2016-02-28 (×2): via RESPIRATORY_TRACT

## 2016-02-28 MED ORDER — SODIUM CHLORIDE 0.9 % IJ SYRG
INTRAMUSCULAR | Status: DC | PRN
Start: 2016-02-28 — End: 2016-02-28

## 2016-02-28 MED ORDER — INSULIN LISPRO 100 UNIT/ML INJECTION
100 unit/mL | Freq: Four times a day (QID) | SUBCUTANEOUS | Status: DC
Start: 2016-02-28 — End: 2016-02-28

## 2016-02-28 MED ORDER — ALBUTEROL SULFATE 0.083 % (0.83 MG/ML) SOLN FOR INHALATION
2.5 mg /3 mL (0.083 %) | Freq: Four times a day (QID) | RESPIRATORY_TRACT | Status: DC
Start: 2016-02-28 — End: 2016-02-28
  Administered 2016-02-28 (×2): via RESPIRATORY_TRACT

## 2016-02-28 MED FILL — PREDNISONE 20 MG TAB: 20 mg | ORAL | Qty: 2

## 2016-02-28 MED FILL — SOLU-MEDROL (PF) 125 MG/2 ML SOLUTION FOR INJECTION: 125 mg/2 mL | INTRAMUSCULAR | Qty: 2

## 2016-02-28 MED FILL — CLONIDINE 0.1 MG TAB: 0.1 mg | ORAL | Qty: 1

## 2016-02-28 MED FILL — ALBUTEROL SULFATE 0.083 % (0.83 MG/ML) SOLN FOR INHALATION: 2.5 mg /3 mL (0.083 %) | RESPIRATORY_TRACT | Qty: 1

## 2016-02-28 MED FILL — IPRATROPIUM BROMIDE 0.02 % SOLN FOR INHALATION: 0.02 % | RESPIRATORY_TRACT | Qty: 2.5

## 2016-02-28 MED FILL — HYDRALAZINE 20 MG/ML IJ SOLN: 20 mg/mL | INTRAMUSCULAR | Qty: 1

## 2016-02-28 MED FILL — HYDROCODONE-ACETAMINOPHEN 5 MG-325 MG TAB: 5-325 mg | ORAL | Qty: 1

## 2016-02-28 MED FILL — GLIPIZIDE 5 MG TAB: 5 mg | ORAL | Qty: 1

## 2016-02-28 MED FILL — LISINOPRIL 20 MG TAB: 20 mg | ORAL | Qty: 1

## 2016-02-28 NOTE — Discharge Summary (Signed)
Same day admit and discharge. Please see H&P for details.

## 2016-02-28 NOTE — Progress Notes (Signed)
0800 pt BP 167/111.pt refuse to have iv access.pt state she is taking BP medicine at home.she does not want iv Blood pressure medicine.pt also refuse insulin.pt state her Blood sugar well controlled with po medicine.Dr Billy Fischer made aware.    1140 pt discharge home.discharge instruction reviewed with pt.prescription medicine and home medicine reviewed with pt.care notes done.pt awake alert with steady gait.pt has all her belonging with her.pt left building.pt driving herself home.

## 2016-02-28 NOTE — H&P (Signed)
Hospitalist Admission Note    NAME: Kathy Bush   DOB:  [birth year]   MRN:  [id number]   Room Number: 214/01  @ [facility]     Date/Time:  02/28/2016 9:36 AM    Patient PCP: PROVIDER UNKNOWN  ______________________________________________________________________    My assessment of this patient's clinical condition and my plan of care is as follows.    Assessment / Plan:    COPD with acute exacerbation (HCC):POA  -marked wheezing refractory to ED and home treatment  -admit, starting on duonebs/ Steroids    Type 2 diabetes mellitus ([place]) [facility]:poa  BSL have been running high,paient has been refusing sliding scale coverage  continue glipizide,jardiance    Essential hypertension   On amlodipine and lisinopril    Tobacco abuse   Nicotine patch offered  Patient was counseled extensively on the need to abstain from tobacco, its addictive tendencies, its deleterious effects on the lungs as well as its financial sequelae        Code Status: FULL  Surrogate Decision Maker:patient comeptent    DVT Prophylaxis: Lovenox  GI Prophylaxis: not indicated    Baseline: funtional        Subjective:     PATIENT ADMITTED OVERNIGHT BY THE TELE HOSPITALIST     CHIEF COMPLAINT: sob/wheezing    HISTORY OF PRESENT ILLNESS:     Kathy Bush is a 55 y.o.  AA female with PMH of  who presents to ED with c/o sob/wheezing.Patient presents with shortness of breath and wheezing since the past 3 weeks which has been getting progressively worse. She states that she tried nebulizer therapy and steroids at home but that did not help . Patient denies fever, chills . she states she has dry cough. she denies history of sick contacts. She does admit to continue tobacco smoking.  chest x-ray was done in ED which was unremarkable. Patient continued to have difficulty breathing despite treatments and Decadron in ED. O2 sats have been 98% on room air. ED hysician requested her to be observed overnight.   Currently patient is doing better after receiving steroids and breathing treatments . Will d/c her on oral steroids . Advised to follow up with PCP.    We were asked to admit for work up and evaluation of the above problems.     Past Medical History:   Diagnosis Date   ??? Asthma    ??? Chronic obstructive pulmonary disease ([place])    ??? Hypertension         Past Surgical History:   Procedure Laterality Date   ??? HX ORTHOPAEDIC      back       Social History   Substance Use Topics   ??? Smoking status: Current Every Day Smoker     Packs/day: 1.00   ??? Smokeless tobacco: Never Used   ??? Alcohol use No        No family history on file.  No Known Allergies     Prior to Admission medications    Medication Sig Start Date End Date Taking? Authorizing Provider   predniSONE (DELTASONE) 20 mg tablet Take 2 Tabs by mouth daily (with breakfast) for 5 days. 02/29/16 03/05/16 Yes Ree Kida, MD   hydrocortisone (CORTAID) 1 % topical cream use thin layer to affected areas 02/28/16  Yes Ree Kida, MD   cloNIDine HCl (CATAPRES) 0.1 mg tablet Take 0.1 mg by mouth nightly.   Yes Historical Provider   amLODIPine (NORVASC) 5 mg  tablet Take 5 mg by mouth daily.   Yes Historical Provider   ibuprofen (MOTRIN) 800 mg tablet Take 800 mg by mouth every eight (8) hours as needed for Pain.   Yes Historical Provider   ALPRAZolam Duanne Moron) 2 mg tablet Take 2 mg by mouth nightly. Indications: anxiety   Yes Historical Provider   fluticasone-vilanterol (BREO ELLIPTA) 200-25 mcg/dose inhaler Take 1 Puff by inhalation daily. 02/16/16  Yes Carlis Stable, PA-C   albuterol (PROVENTIL HFA, VENTOLIN HFA, PROAIR HFA) 90 mcg/actuation inhaler Take 1-2 Puffs by inhalation every four (4) hours as needed for Wheezing. 02/08/16  Yes Grier Rocher, MD   HYDROcodone-acetaminophen (XODOL) 7.5-300 mg tablet Take 1 Tab by mouth as needed.   Yes Phys Other, MD   lisinopril (PRINIVIL, ZESTRIL) 20 mg tablet Take 20 mg by mouth daily.   Yes Phys Other, MD    glipiZIDE (GLUCOTROL) 5 mg tablet Take 5 mg by mouth daily.   Yes Phys Other, MD   empagliflozin (JARDIANCE) 25 mg tablet Take 25 mg by mouth daily.   Yes Phys Other, MD   hydrOXYzine HCl (ATARAX) 50 mg tablet Take 1 Tab by mouth every six (6) hours as needed for Itching. 01/09/16  Yes Paulo Fruit, PA   buPROPion XL (WELLBUTRIN XL) 300 mg XL tablet Take 300 mg by mouth daily.    Historical Provider       REVIEW OF SYSTEMS:     I am not able to complete the review of systems because:   The patient is intubated and sedated    The patient has altered mental status due to his acute medical problems    The patient has baseline aphasia from prior stroke(s)    The patient has baseline dementia and is not reliable historian    The patient is in acute medical distress and unable to provide information           Total of 12 systems reviewed as follows:       POSITIVE= underlined text  Negative = text not underlined  General:  fever, chills, sweats, generalized weakness, weight loss/gain,      loss of appetite   Eyes:    blurred vision, eye pain, loss of vision, double vision  ENT:    rhinorrhea, pharyngitis   Respiratory:   cough, sputum production, SOB, DOE, wheezing, pleuritic pain   Cardiology:   chest pain, palpitations, orthopnea, PND, edema, syncope   Gastrointestinal:  abdominal pain , N/V, diarrhea, dysphagia, constipation, bleeding   Genitourinary:  frequency, urgency, dysuria, hematuria, incontinence   Muskuloskeletal :  arthralgia, myalgia, back pain  Hematology:  easy bruising, nose or gum bleeding, lymphadenopathy   Dermatological: rash, ulceration, pruritis, color change / jaundice  Endocrine:   hot flashes or polydipsia   Neurological:  headache, dizziness, confusion, focal weakness, paresthesia,     Speech difficulties, memory loss, gait difficulty  Psychological: Feelings of anxiety, depression, agitation    Objective:   VITALS:    Visit Vitals    ??? BP (!) 161/111 (BP 1 Location: Left arm, BP Patient Position: Sitting)   ??? Pulse 88   ??? Temp 97.6 ??F (36.4 ??C)   ??? Resp 18   ??? Ht 5' 2" (1.575 m)   ??? Wt 89.8 kg (198 lb)   ??? SpO2 100%   ??? BMI 36.21 kg/m2       PHYSICAL EXAM:    General:    Alert, cooperative, no distress, appears stated  age.     HEENT: Atraumatic, anicteric sclerae, pink conjunctivae     No oral ulcers, mucosa moist, throat clear, dentition fair  Neck:  Supple, symmetrical,  thyroid: non tender  Lungs:   Clear to auscultation bilaterally.  Minimal exp  Wheezing at bases b/l,or Rhonchi. No rales.  Chest wall:  No tenderness  No Accessory muscle use.  Heart:   Regular  rhythm,  No  murmur   No edema  Abdomen:   Soft, non-tender. Not distended.  Bowel sounds normal  Extremities: No cyanosis.  No clubbing,      Skin turgor normal, Capillary refill normal, Radial dial pulse 2+  Skin:     Not pale.  Not Jaundiced  No rashes   Psych:  Good insight.  Not depressed.  Not anxious or agitated.  Neurologic: EOMs intact. No facial asymmetry. No aphasia or slurred speech. Symmetrical strength, Sensation grossly intact. Alert and oriented X 4.     ______________________________________________________________________    Care Plan discussed with:  Patient/Family and Nurse    Expected  Disposition:  Home w/Family  ________________________________________________________________________  TOTAL TIME:  38 Minutes    Critical Care Provided     Minutes non procedure based      Comments     Reviewed previous records   >50% of visit spent in counseling and coordination of care x Discussion with patient and/or family and questions answered       ________________________________________________________________________  Signed: Ree Kida, MD    Procedures: see electronic medical records for all procedures/Xrays and details which were not copied into this note but were reviewed prior to creation of Plan.    LAB DATA REVIEWED:     Recent Results (from the past 24 hour(s))   CBC WITH AUTOMATED DIFF    Collection Time: 02/27/16  4:33 PM   Result Value Ref Range    WBC 3.4 (L) 3.6 - 11.0 K/uL    RBC 4.64 3.80 - 5.20 M/uL    HGB 13.4 11.5 - 16.0 g/dL    HCT 42.0 35.0 - 47.0 %    MCV 90.5 80.0 - 99.0 FL    MCH 28.9 26.0 - 34.0 PG    MCHC 31.9 30.0 - 36.5 g/dL    RDW 14.3 11.5 - 14.5 %    PLATELET 215 150 - 400 K/uL    NEUTROPHILS 41 32 - 75 %    LYMPHOCYTES 46 12 - 49 %    MONOCYTES 11 5 - 13 %    EOSINOPHILS 2 0 - 7 %    BASOPHILS 0 0 - 1 %    ABS. NEUTROPHILS 1.4 (L) 1.8 - 8.0 K/UL    ABS. LYMPHOCYTES 1.6 0.8 - 3.5 K/UL    ABS. MONOCYTES 0.4 0.0 - 1.0 K/UL    ABS. EOSINOPHILS 0.1 0.0 - 0.4 K/UL    ABS. BASOPHILS 0.0 0.0 - 0.1 K/UL   METABOLIC PANEL, COMPREHENSIVE    Collection Time: 02/27/16  4:33 PM   Result Value Ref Range    Sodium 139 136 - 145 mmol/L    Potassium 3.5 3.5 - 5.1 mmol/L    Chloride 102 97 - 108 mmol/L    CO2 26 21 - 32 mmol/L    Anion gap 11 5 - 15 mmol/L    Glucose 221 (H) 65 - 100 mg/dL    BUN 13 6 - 20 MG/DL    Creatinine 0.98 0.55 - 1.02 MG/DL    BUN/Creatinine ratio 13  12 - 20      GFR est AA >60 >60 ml/min/1.47m    GFR est non-AA 59 (L) >60 ml/min/1.715m   Calcium 8.4 (L) 8.5 - 10.1 MG/DL    Bilirubin, total 0.3 0.2 - 1.0 MG/DL    ALT (SGPT) 20 12 - 78 U/L    AST (SGOT) 15 15 - 37 U/L    Alk. phosphatase 89 45 - 117 U/L    Protein, total 7.2 6.4 - 8.2 g/dL    Albumin 3.4 (L) 3.5 - 5.0 g/dL    Globulin 3.8 2.0 - 4.0 g/dL    A-G Ratio 0.9 (L) 1.1 - 2.2     TROPONIN I    Collection Time: 02/27/16  4:33 PM   Result Value Ref Range    Troponin-I, Qt. 0.04 <0.05 ng/mL   CK W/ CKMB & INDEX    Collection Time: 02/27/16  4:33 PM   Result Value Ref Range    CK 199 (H) 26 - 192 U/L    CK - MB 1.1 <3.6 NG/ML    CK-MB Index 0.6 0 - 2.5     MAGNESIUM    Collection Time: 02/27/16  4:33 PM   Result Value Ref Range    Magnesium 2.1 1.6 - 2.4 mg/dL   GLUCOSE, POC    Collection Time: 02/27/16  9:34 PM   Result Value Ref Range     Glucose (POC) 311 (H) 65 - 100 mg/dL    Performed by LeBevelyn Ngo  GLUCOSE, POC    Collection Time: 02/28/16  8:35 AM   Result Value Ref Range    Glucose (POC) 246 (H) 65 - 100 mg/dL    Performed by KaMindi Curling

## 2016-02-28 NOTE — Progress Notes (Signed)
CM opened case for assessment of D/C planning needs, CM reviewed chart. Pt presented to ED via car with c/o cough and wheezing.Admitted with COPD. Patient noted to be out of state, [place]. Patient has [place].    Care Management Interventions  PCP Verified by CM: No  Palliative Care Consult (Criteria: CHF and RRAT>21): No  Reason for No Palliative Care Consult: Other (see comment) (Patient does not need a Palliative consult. )  Mode of Transport at Discharge: Self  Transition of Care Consult (CM Consult): Discharge Planning  Discharge Durable Medical Equipment: No  Physical Therapy Consult: No  Occupational Therapy Consult: No  Speech Therapy Consult: No  Confirm Follow Up Transport: Self (Patient has her own personal car.)  Plan discussed with Pt/Family/Caregiver: Yes  Discharge Location  Discharge Placement: Home with outpatient services   Care Management will follow up with on Monday July 17th, 17 for f/u care.   Lauro Franklin, [place], [place]

## 2016-03-01 ENCOUNTER — Inpatient Hospital Stay
Admit: 2016-03-01 | Discharge: 2016-03-01 | Disposition: A | Discharge status: Home / Self Care | Payer: MEDICAID | Attending: Emergency Medicine

## 2016-03-01 ENCOUNTER — Emergency Department: Admit: 2016-03-01 | Payer: MEDICAID | Primary: Family

## 2016-03-01 DIAGNOSIS — J440 Chronic obstructive pulmonary disease with acute lower respiratory infection: Secondary | ICD-10-CM

## 2016-03-01 LAB — CBC WITH AUTOMATED DIFF
ABS. BASOPHILS: 0 10*3/uL (ref 0.0–0.1)
ABS. EOSINOPHILS: 0 10*3/uL (ref 0.0–0.4)
ABS. LYMPHOCYTES: 1.9 10*3/uL (ref 0.8–3.5)
ABS. MONOCYTES: 0.6 10*3/uL (ref 0.0–1.0)
ABS. NEUTROPHILS: 5.9 10*3/uL (ref 1.8–8.0)
BASOPHILS: 0 % (ref 0–1)
EOSINOPHILS: 0 % (ref 0–7)
HCT: 43.8 % (ref 35.0–47.0)
HGB: 14.6 g/dL (ref 11.5–16.0)
LYMPHOCYTES: 22 % (ref 12–49)
MCH: 29.7 PG (ref 26.0–34.0)
MCHC: 33.3 g/dL (ref 30.0–36.5)
MCV: 89.2 FL (ref 80.0–99.0)
MONOCYTES: 7 % (ref 5–13)
NEUTROPHILS: 71 % (ref 32–75)
PLATELET: 268 10*3/uL (ref 150–400)
RBC: 4.91 M/uL (ref 3.80–5.20)
RDW: 13.9 % (ref 11.5–14.5)
WBC: 8.4 10*3/uL (ref 3.6–11.0)

## 2016-03-01 LAB — METABOLIC PANEL, COMPREHENSIVE
A-G Ratio: 0.8 — ABNORMAL LOW (ref 1.1–2.2)
ALT (SGPT): 19 U/L (ref 12–78)
AST (SGOT): 10 U/L — ABNORMAL LOW (ref 15–37)
Albumin: 3.4 g/dL — ABNORMAL LOW (ref 3.5–5.0)
Alk. phosphatase: 93 U/L (ref 45–117)
Anion gap: 12 mmol/L (ref 5–15)
BUN/Creatinine ratio: 11 — ABNORMAL LOW (ref 12–20)
BUN: 11 MG/DL (ref 6–20)
Bilirubin, total: 0.4 MG/DL (ref 0.2–1.0)
CO2: 28 mmol/L (ref 21–32)
Calcium: 8.8 MG/DL (ref 8.5–10.1)
Chloride: 99 mmol/L (ref 97–108)
Creatinine: 0.99 MG/DL (ref 0.55–1.02)
GFR est AA: >60 mL/min/{1.73_m2} (ref >60)
GFR est non-AA: 58 mL/min/{1.73_m2} — ABNORMAL LOW (ref >60)
Globulin: 4.5 g/dL — ABNORMAL HIGH (ref 2.0–4.0)
Glucose: 220 mg/dL — ABNORMAL HIGH (ref 65–100)
Potassium: 3.2 mmol/L — ABNORMAL LOW (ref 3.5–5.1)
Protein, total: 7.9 g/dL (ref 6.4–8.2)
Sodium: 139 mmol/L (ref 136–145)

## 2016-03-01 LAB — GLUCOSE, POC: Glucose (POC): 155 mg/dL — ABNORMAL HIGH (ref 65–100)

## 2016-03-01 MED ORDER — GLIPIZIDE 5 MG TAB
5 mg | Freq: Every day | ORAL | Status: DC
Start: 2016-03-01 — End: 2016-03-03
  Administered 2016-03-02 – 2016-03-03 (×2): via ORAL

## 2016-03-01 MED ORDER — SODIUM CHLORIDE 0.9% BOLUS IV
0.9 % | INTRAVENOUS | Status: AC
Start: 2016-03-01 — End: 2016-03-01
  Administered 2016-03-01: 20:00:00 via INTRAVENOUS

## 2016-03-01 MED ORDER — ADV ADDAPTOR
1 gram | Status: DC
Start: 2016-03-01 — End: 2016-03-01
  Administered 2016-03-01: 21:00:00 via INTRAVENOUS

## 2016-03-01 MED ORDER — LISINOPRIL 20 MG TAB
20 mg | Freq: Every day | ORAL | Status: DC
Start: 2016-03-01 — End: 2016-03-03
  Administered 2016-03-02 – 2016-03-03 (×2): via ORAL

## 2016-03-01 MED ORDER — SODIUM CHLORIDE 0.9 % IJ SYRG
INTRAMUSCULAR | Status: DC | PRN
Start: 2016-03-01 — End: 2016-03-03

## 2016-03-01 MED ORDER — HYDROXYZINE 25 MG TAB
25 mg | Freq: Four times a day (QID) | ORAL | Status: DC | PRN
Start: 2016-03-01 — End: 2016-03-02

## 2016-03-01 MED ORDER — GLUCAGON 1 MG INJECTION
1 mg | INTRAMUSCULAR | Status: DC | PRN
Start: 2016-03-01 — End: 2016-03-03

## 2016-03-01 MED ORDER — GLUCOSE 4 GRAM CHEWABLE TAB
4 gram | ORAL | Status: DC | PRN
Start: 2016-03-01 — End: 2016-03-03

## 2016-03-01 MED ORDER — AZITHROMYCIN 500 MG IV SOLUTION
500 mg | INTRAVENOUS | Status: DC
Start: 2016-03-01 — End: 2016-03-01

## 2016-03-01 MED ORDER — IPRATROPIUM-ALBUTEROL 2.5 MG-0.5 MG/3 ML NEB SOLUTION
2.5 mg-0.5 mg/3 ml | Freq: Four times a day (QID) | RESPIRATORY_TRACT | Status: DC
Start: 2016-03-01 — End: 2016-03-03
  Administered 2016-03-02 – 2016-03-03 (×5): via RESPIRATORY_TRACT

## 2016-03-01 MED ORDER — PREDNISONE 20 MG TAB
20 mg | Freq: Every day | ORAL | Status: DC
Start: 2016-03-01 — End: 2016-03-03
  Administered 2016-03-02: 12:00:00 via ORAL

## 2016-03-01 MED ORDER — BUPROPION XL 150 MG 24 HR TAB
150 mg | Freq: Every day | ORAL | Status: DC
Start: 2016-03-01 — End: 2016-03-01

## 2016-03-01 MED ORDER — AMLODIPINE 5 MG TAB
5 mg | Freq: Every day | ORAL | Status: DC
Start: 2016-03-01 — End: 2016-03-03
  Administered 2016-03-02 – 2016-03-03 (×2): via ORAL

## 2016-03-01 MED ORDER — HYDROCORTISONE 1 % TOPICAL CREAM
1 % | Freq: Every day | CUTANEOUS | Status: DC
Start: 2016-03-01 — End: 2016-03-03
  Administered 2016-03-02 – 2016-03-03 (×2): via TOPICAL

## 2016-03-01 MED ORDER — DEXTROSE 50% IN WATER (D50W) IV SYRG
INTRAVENOUS | Status: DC | PRN
Start: 2016-03-01 — End: 2016-03-03

## 2016-03-01 MED ORDER — HYDROCODONE-ACETAMINOPHEN 7.5 MG-325 MG TAB
Freq: Every day | ORAL | Status: DC | PRN
Start: 2016-03-01 — End: 2016-03-02

## 2016-03-01 MED ORDER — ADV ADDAPTOR
500 mg | Status: DC
Start: 2016-03-01 — End: 2016-03-03
  Administered 2016-03-01 – 2016-03-02 (×2): via INTRAVENOUS

## 2016-03-01 MED ORDER — PREDNISONE 20 MG TAB
20 mg | Freq: Every day | ORAL | Status: DC
Start: 2016-03-01 — End: 2016-03-01
  Administered 2016-03-01: 20:00:00 via ORAL

## 2016-03-01 MED ORDER — FLUTICASONE-SALMETEROL 250 MCG-50 MCG/DOSE DISK DEVICE FOR INHALATION
250-50 mcg/dose | Freq: Two times a day (BID) | RESPIRATORY_TRACT | Status: DC
Start: 2016-03-01 — End: 2016-03-03
  Administered 2016-03-02 – 2016-03-03 (×4): via RESPIRATORY_TRACT

## 2016-03-01 MED ORDER — ENOXAPARIN 40 MG/0.4 ML SUB-Q SYRINGE
40 mg/0.4 mL | SUBCUTANEOUS | Status: DC
Start: 2016-03-01 — End: 2016-03-03

## 2016-03-01 MED ORDER — .PHARMACY TO SUBSTITUTE PER PROTOCOL
Status: DC | PRN
Start: 2016-03-01 — End: 2016-03-01

## 2016-03-01 MED ORDER — IPRATROPIUM-ALBUTEROL 2.5 MG-0.5 MG/3 ML NEB SOLUTION
2.5 mg-0.5 mg/3 ml | RESPIRATORY_TRACT | Status: AC
Start: 2016-03-01 — End: 2016-03-01
  Administered 2016-03-01: 17:00:00 via RESPIRATORY_TRACT

## 2016-03-01 MED ORDER — HYDROCORTISONE 1 % TOPICAL CREAM
1 % | Freq: Two times a day (BID) | CUTANEOUS | Status: DC | PRN
Start: 2016-03-01 — End: 2016-03-01

## 2016-03-01 MED ORDER — IPRATROPIUM-ALBUTEROL 2.5 MG-0.5 MG/3 ML NEB SOLUTION
2.5 mg-0.5 mg/3 ml | RESPIRATORY_TRACT | Status: AC
Start: 2016-03-01 — End: 2016-03-01
  Administered 2016-03-01: 19:00:00 via RESPIRATORY_TRACT

## 2016-03-01 MED ORDER — SODIUM CHLORIDE 0.9 % IV PIGGY BACK
2 gram | INTRAVENOUS | Status: DC
Start: 2016-03-01 — End: 2016-03-01

## 2016-03-01 MED ORDER — DEXTROMETHORPHAN-GUAIFENESIN SR 30 MG-600 MG 12 HR TAB
600-30 mg | Freq: Two times a day (BID) | ORAL | Status: DC | PRN
Start: 2016-03-01 — End: 2016-03-03
  Administered 2016-03-01 – 2016-03-03 (×3): via ORAL

## 2016-03-01 MED ORDER — ACETAMINOPHEN 325 MG TABLET
325 mg | Freq: Four times a day (QID) | ORAL | Status: DC | PRN
Start: 2016-03-01 — End: 2016-03-03
  Administered 2016-03-01: 22:00:00 via ORAL

## 2016-03-01 MED ORDER — SODIUM CHLORIDE 0.9 % IJ SYRG
Freq: Three times a day (TID) | INTRAMUSCULAR | Status: DC
Start: 2016-03-01 — End: 2016-03-03
  Administered 2016-03-01 – 2016-03-03 (×6): via INTRAVENOUS

## 2016-03-01 MED ORDER — ALPRAZOLAM 1 MG TAB
1 mg | Freq: Every evening | ORAL | Status: DC | PRN
Start: 2016-03-01 — End: 2016-03-03
  Administered 2016-03-02 – 2016-03-03 (×2): via ORAL

## 2016-03-01 MED ORDER — INSULIN LISPRO 100 UNIT/ML INJECTION
100 unit/mL | Freq: Four times a day (QID) | SUBCUTANEOUS | Status: DC
Start: 2016-03-01 — End: 2016-03-03
  Administered 2016-03-02 – 2016-03-03 (×4): via SUBCUTANEOUS

## 2016-03-01 MED ORDER — CLONIDINE 0.1 MG TAB
0.1 mg | Freq: Every evening | ORAL | Status: DC
Start: 2016-03-01 — End: 2016-03-03
  Administered 2016-03-02 – 2016-03-03 (×2): via ORAL

## 2016-03-01 MED ORDER — FLUTICASONE 200 MCG-VILANTEROL 25 MCG/DOSE BREATH ACTIVATED INHALER
200-25 mcg/dose | Freq: Every day | RESPIRATORY_TRACT | Status: DC
Start: 2016-03-01 — End: 2016-03-01

## 2016-03-01 MED ORDER — SODIUM CHLORIDE 0.9 % IV PIGGY BACK
2 gram | INTRAVENOUS | Status: DC
Start: 2016-03-01 — End: 2016-03-03
  Administered 2016-03-01 – 2016-03-02 (×2): via INTRAVENOUS

## 2016-03-01 MED ORDER — SODIUM CHLORIDE 0.9 % IV PIGGY BACK
2 gram | INTRAVENOUS | Status: DC
Start: 2016-03-01 — End: 2016-03-01
  Administered 2016-03-01: 18:00:00 via INTRAVENOUS

## 2016-03-01 MED FILL — ENOXAPARIN 40 MG/0.4 ML SUB-Q SYRINGE: 40 mg/0.4 mL | SUBCUTANEOUS | Qty: 0.4

## 2016-03-01 MED FILL — ALPRAZOLAM 1 MG TAB: 1 mg | ORAL | Qty: 2

## 2016-03-01 MED FILL — MAPAP (ACETAMINOPHEN) 325 MG TABLET: 325 mg | ORAL | Qty: 2

## 2016-03-01 MED FILL — IPRATROPIUM-ALBUTEROL 2.5 MG-0.5 MG/3 ML NEB SOLUTION: 2.5 mg-0.5 mg/3 ml | RESPIRATORY_TRACT | Qty: 3

## 2016-03-01 MED FILL — HYDROCORTISONE 1 % TOPICAL CREAM: 1 % | CUTANEOUS | Qty: 28

## 2016-03-01 MED FILL — MUCINEX DM 30 MG-600 MG TABLET,EXTENDED RELEASE 12 HR: 30-600 mg | ORAL | Qty: 1

## 2016-03-01 MED FILL — ADVAIR DISKUS 250 MCG-50 MCG/DOSE POWDER FOR INHALATION: 250-50 mcg/dose | RESPIRATORY_TRACT | Qty: 14

## 2016-03-01 MED FILL — SODIUM CHLORIDE 0.9 % IV: INTRAVENOUS | Qty: 1000

## 2016-03-01 MED FILL — CEFTRIAXONE 2 GRAM SOLUTION FOR INJECTION: 2 gram | INTRAMUSCULAR | Qty: 2

## 2016-03-01 MED FILL — BD POSIFLUSH NORMAL SALINE 0.9 % INJECTION SYRINGE: INTRAMUSCULAR | Qty: 10

## 2016-03-01 MED FILL — AZITHROMYCIN 500 MG IV SOLUTION: 500 mg | INTRAVENOUS | Qty: 5

## 2016-03-01 MED FILL — PREDNISONE 20 MG TAB: 20 mg | ORAL | Qty: 3

## 2016-03-01 NOTE — H&P (Signed)
Hospitalist Admission Note    NAME: Kathy Bush   DOB:  1961/05/27   MRN:  [id number]   Room Number: ER01/01  @ [facility]     Date/Time:  03/01/2016 3:41 PM    Patient PCP: PROVIDER UNKNOWN  ______________________________________________________________________    My assessment of this patient's clinical condition and my plan of care is as follows.    Assessment / Plan:    CAP (community acquired pneumonia)POA  CXR-Left basilar airspace opacity may represent infection  Increased RR. 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[place]) :poa  continue glipizide,jardiance  ISS     Essential hypertension   On amlodipine and lisinopril     Hypokalemia:  Replace    Tobacco dependence  Nicotine patch offered  Patient was counseled extensively on the need to abstain from tobacco, its addictive tendencies, its deleterious effects on the lungs as well as its financial sequelae    OBESITY    Code Status: full  Surrogate Decision Maker: patient competent    DVT Prophylaxis: Lovenox  GI Prophylaxis: not indicated    Baseline: functional, tobacco dependence        Subjective:   CHIEF COMPLAINT: COUGH/WHEEZING    HISTORY OF PRESENT ILLNESS:     Kathy Bush is a 55 y.o.  African American female with PMH of COPD,DM2,Tobacco abuse who presents to ED with c/o cough,SOB,Wheezing.patient presents today with increased shortness of breath ,cough which is more productive and wheezing . She was discharged on 02/27/16 with prescription for prednisone, cough syrup and inhalers ,however  her symptoms got worse past two days. She states she has`t  smoked past 3 days.She reports generalized body aches, cough which is productive with blood tinged sputum and low-grade fevers at home.   In ED,Chest x-ray was done - suspicious for left  basilar opacity.blood /sputum culture sent.IV antibiotics and fluids administered.    We were asked to admit for work up and evaluation of the above problems.     Past Medical History:   Diagnosis Date   ??? Asthma    ??? Chronic obstructive pulmonary disease ([place])    ??? Hypertension         Past Surgical History:   Procedure Laterality Date   ??? HX ORTHOPAEDIC      back       Social History   Substance Use Topics   ??? Smoking status: Current Every Day Smoker     Packs/day: 1.00   ??? Smokeless tobacco: Never Used   ??? Alcohol use No        No family history on file.  No Known Allergies     Prior to Admission medications    Medication Sig Start Date End Date Taking? Authorizing Provider   predniSONE (DELTASONE) 20 mg tablet Take 2 Tabs by mouth daily (with breakfast) for 5 days. 02/29/16 03/05/16  Ree Kida, MD   hydrocortisone (CORTAID) 1 % topical cream use thin layer to affected areas 02/28/16   Ree Kida, MD   guaiFENesin-dextromethorphan (GUAIFENESIN-DM) 10-100 mg/5 mL liqd Take 10 mL by mouth every four (4) hours as needed. Indications: COUGH 02/28/16  Ree Kida, MD   cloNIDine HCl (CATAPRES) 0.1 mg tablet Take 0.1 mg by mouth nightly.    Historical Provider   amLODIPine (NORVASC) 5 mg tablet Take 5 mg by mouth daily.    Historical Provider   buPROPion XL (WELLBUTRIN XL) 300 mg XL tablet Take 300 mg by mouth daily.    Historical Provider   ibuprofen (MOTRIN) 800 mg tablet Take 800 mg by mouth every eight (8) hours as needed for Pain.    Historical Provider   ALPRAZolam Duanne Moron) 2 mg tablet Take 2 mg by mouth nightly. Indications: anxiety    Historical Provider   fluticasone-vilanterol (BREO ELLIPTA) 200-25 mcg/dose inhaler Take 1 Puff by inhalation daily. 02/16/16   Carlis Stable, PA-C    albuterol (PROVENTIL HFA, VENTOLIN HFA, PROAIR HFA) 90 mcg/actuation inhaler Take 1-2 Puffs by inhalation every four (4) hours as needed for Wheezing. 02/08/16   Grier Rocher, MD   HYDROcodone-acetaminophen (XODOL) 7.5-300 mg tablet Take 1 Tab by mouth as needed.    Phys Other, MD   lisinopril (PRINIVIL, ZESTRIL) 20 mg tablet Take 20 mg by mouth daily.    Phys Other, MD   glipiZIDE (GLUCOTROL) 5 mg tablet Take 5 mg by mouth daily.    Phys Other, MD   empagliflozin (JARDIANCE) 25 mg tablet Take 25 mg by mouth daily.    Phys Other, MD   hydrOXYzine HCl (ATARAX) 50 mg tablet Take 1 Tab by mouth every six (6) hours as needed for Itching. 01/09/16   Paulo Fruit, PA       REVIEW OF SYSTEMS:     I am not able to complete the review of systems because:   The patient is intubated and sedated    The patient has altered mental status due to his acute medical problems    The patient has baseline aphasia from prior stroke(s)    The patient has baseline dementia and is not reliable historian    The patient is in acute medical distress and unable to provide information           Total of 12 systems reviewed as follows:       POSITIVE= underlined text  Negative = text not underlined  General:  fever, chills, sweats, generalized weakness, weight loss/gain,      loss of appetite   Eyes:    blurred vision, eye pain, loss of vision, double vision  ENT:    rhinorrhea, pharyngitis   Respiratory:   cough, sputum production, SOB, DOE, wheezing, pleuritic pain   Cardiology:   chest pain, palpitations, orthopnea, PND, edema, syncope   Gastrointestinal:  abdominal pain , N/V, diarrhea, dysphagia, constipation, bleeding   Genitourinary:  frequency, urgency, dysuria, hematuria, incontinence   Muskuloskeletal :  arthralgia, myalgia, back pain  Hematology:  easy bruising, nose or gum bleeding, lymphadenopathy   Dermatological: rash, ulceration, pruritis, color change / jaundice  Endocrine:   hot flashes or polydipsia    Neurological:  headache, dizziness, confusion, focal weakness, paresthesia,     Speech difficulties, memory loss, gait difficulty  Psychological: Feelings of anxiety, depression, agitation    Objective:   VITALS:    Visit Vitals   ??? BP 147/70 (BP 1 Location: Left arm, BP Patient Position: At rest)   ??? Pulse (!) 57   ??? Temp 99.5 ??F (37.5 ??C)   ??? Resp 30   ??? Ht _0  (1.575 m)   ??? Wt 90.7 kg (200 lb)   ???  SpO2 93%   ??? BMI 36.58 kg/m2       PHYSICAL EXAM:    General:    Alert, cooperative, no distress, appears stated age.     HEENT: Atraumatic, anicteric sclerae, pink conjunctivae     No oral ulcers, mucosa moist, throat clear, dentition fair  Neck:  Supple, symmetrical,  thyroid: non tender  Lungs:   Decreased BS on left base+.  exp Wheezing + hear ant and post fields.no Rhonchi. No rales.  Chest wall:  No tenderness  No Accessory muscle use.  Heart:   Regular  rhythm,  No  murmur   No edema  Abdomen:   Soft, non-tender. Not distended.  Bowel sounds normal  Extremities: No cyanosis.  No clubbing,      Skin turgor normal, Capillary refill normal, Radial dial pulse 2+  Skin:     Not pale.  Not Jaundiced  No rashes   Psych:  Good insight.  Not depressed.  Not anxious or agitated.  Neurologic: EOMs intact. No facial asymmetry. No aphasia or slurred speech. Symmetrical strength, Sensation grossly intact. Alert and oriented X 4.     ______________________________________________________________________    Care Plan discussed with:  Patient/Family and Nurse    Expected  Disposition:  Home w/Family  ________________________________________________________________________  TOTAL TIME:  62 Minutes    Critical Care Provided     Minutes non procedure based      Comments     Reviewed previous records   >50% of visit spent in counseling and coordination of care x Discussion with patient and/or family and questions answered       ________________________________________________________________________  Signed: Ree Kida, MD     Procedures: see electronic medical records for all procedures/Xrays and details which were not copied into this note but were reviewed prior to creation of Plan.    LAB DATA REVIEWED:    Recent Results (from the past 24 hour(s))   CBC WITH AUTOMATED DIFF    Collection Time: 03/01/16  2:40 PM   Result Value Ref Range    WBC 8.4 3.6 - 11.0 K/uL    RBC 4.91 3.80 - 5.20 M/uL    HGB 14.6 11.5 - 16.0 g/dL    HCT 43.8 35.0 - 47.0 %    MCV 89.2 80.0 - 99.0 FL    MCH 29.7 26.0 - 34.0 PG    MCHC 33.3 30.0 - 36.5 g/dL    RDW 13.9 11.5 - 14.5 %    PLATELET 268 150 - 400 K/uL    NEUTROPHILS 71 32 - 75 %    LYMPHOCYTES 22 12 - 49 %    MONOCYTES 7 5 - 13 %    EOSINOPHILS 0 0 - 7 %    BASOPHILS 0 0 - 1 %    ABS. NEUTROPHILS 5.9 1.8 - 8.0 K/UL    ABS. LYMPHOCYTES 1.9 0.8 - 3.5 K/UL    ABS. MONOCYTES 0.6 0.0 - 1.0 K/UL    ABS. EOSINOPHILS 0.0 0.0 - 0.4 K/UL    ABS. BASOPHILS 0.0 0.0 - 0.1 K/UL   METABOLIC PANEL, COMPREHENSIVE    Collection Time: 03/01/16  2:40 PM   Result Value Ref Range    Sodium 139 136 - 145 mmol/L    Potassium 3.2 (L) 3.5 - 5.1 mmol/L    Chloride 99 97 - 108 mmol/L    CO2 28 21 - 32 mmol/L    Anion gap 12 5 - 15 mmol/L    Glucose  220 (H) 65 - 100 mg/dL    BUN 11 6 - 20 MG/DL    Creatinine 0.99 0.55 - 1.02 MG/DL    BUN/Creatinine ratio 11 (L) 12 - 20      GFR est AA >60 >60 ml/min/1.37m    GFR est non-AA 58 (L) >60 ml/min/1.727m   Calcium 8.8 8.5 - 10.1 MG/DL    Bilirubin, total 0.4 0.2 - 1.0 MG/DL    ALT (SGPT) 19 12 - 78 U/L    AST (SGOT) 10 (L) 15 - 37 U/L    Alk. phosphatase 93 45 - 117 U/L    Protein, total 7.9 6.4 - 8.2 g/dL    Albumin 3.4 (L) 3.5 - 5.0 g/dL    Globulin 4.5 (H) 2.0 - 4.0 g/dL    A-G Ratio 0.8 (L) 1.1 - 2.2

## 2016-03-01 NOTE — Progress Notes (Addendum)
1650) Pt arrive to unit.  37) Consult Dr. Billy Fischer for headache and fever 100.1.  Order for tylenol.  1718) Verify 2 g of rocephin with Dr. Wende Neighbors) Bedside shift change report given to Alyse Low, RN (oncoming nurse) by Nira Conn, RN (offgoing nurse). Report included the following information SBAR, Kardex, MAR, Accordion and Recent Results.

## 2016-03-01 NOTE — Progress Notes (Signed)
Pharmacy Medication Reconciliation     Recommendations/Findings:   1) removed wellbutrin xr 300 mg - pt states she stopped taking it  2) clarified hydrocortisone 1% cr as daily use for face in the morning    -Clarified PTA med list with patient. PTA medication list was corrected to the following:     Prior to Admission Medications   Prescriptions Last Dose Informant Patient Reported? Taking?   ALPRAZolam (XANAX) 2 mg tablet   Yes No   Sig: Take 2 mg by mouth nightly as needed for Anxiety. Indications: anxiety   HYDROcodone-acetaminophen (XODOL) 7.5-300 mg tablet   Yes No   Sig: Take 1 Tab by mouth as needed.   albuterol (PROVENTIL HFA, VENTOLIN HFA, PROAIR HFA) 90 mcg/actuation inhaler   No No   Sig: Take 1-2 Puffs by inhalation every four (4) hours as needed for Wheezing.   amLODIPine (NORVASC) 5 mg tablet   Yes No   Sig: Take 5 mg by mouth daily.   cloNIDine HCl (CATAPRES) 0.1 mg tablet   Yes No   Sig: Take 0.1 mg by mouth nightly.   empagliflozin (JARDIANCE) 25 mg tablet   Yes No   Sig: Take 25 mg by mouth daily.   fluticasone-vilanterol (BREO ELLIPTA) 200-25 mcg/dose inhaler   No No   Sig: Take 1 Puff by inhalation daily.   glipiZIDE (GLUCOTROL) 5 mg tablet   Yes No   Sig: Take 5 mg by mouth daily.   guaiFENesin-dextromethorphan (GUAIFENESIN-DM) 10-100 mg/5 mL liqd   No No   Sig: Take 10 mL by mouth every four (4) hours as needed. Indications: COUGH   hydrOXYzine HCl (ATARAX) 50 mg tablet   No No   Sig: Take 1 Tab by mouth every six (6) hours as needed for Itching.   hydrocortisone (CORTAID) 1 % topical cream   No No   Sig: use thin layer to affected areas   ibuprofen (MOTRIN) 800 mg tablet   Yes No   Sig: Take 800 mg by mouth every eight (8) hours as needed for Pain.   lisinopril (PRINIVIL, ZESTRIL) 20 mg tablet   Yes No   Sig: Take 20 mg by mouth daily.   predniSONE (DELTASONE) 20 mg tablet   No No   Sig: Take 2 Tabs by mouth daily (with breakfast) for 5 days.      Facility-Administered Medications: None           Thank you,  Bennie Hind, [facility]

## 2016-03-01 NOTE — ED Notes (Signed)
Pt for admission to hospital. Plan of care accepted by patient. IV site re sited related to pt complaining of pain. No S/S of infiltration and site flushes well. Pt sts she had and IV on Friday in the same place.       TRANSFER - IN REPORT:    Verbal report received from Dukes R.N.(name) on Kathy Bush  being received from 2nd room 213(unit) for routine progression of care      Report consisted of patient???s Situation, Background, Assessment and   Recommendations(SBAR).     Information from the following report(s) SBAR, Kardex and ED Summary was reviewed with the receiving nurse.    Opportunity for questions and clarification was provided.      Assessment completed upon patient???s arrival to unit and care assumed.

## 2016-03-01 NOTE — Other (Signed)
..  TRANSFER - IN REPORT:    Verbal report received from [place], RN(name) on Tuyet Heyde  being received from ED (unit) for routine progression of care      Report consisted of patient???s Situation, Background, Assessment and   Recommendations(SBAR).     Information from the following report(s) SBAR, Kardex, MAR, Accordion and Recent Results was reviewed with the receiving nurse.    Opportunity for questions and clarification was provided.      Assessment completed upon patient???s arrival to unit and care assumed.

## 2016-03-01 NOTE — ED Provider Notes (Signed)
HPI Comments: Kathy Bush is a 55 y.o. female with PMhx significant for asthma, COPD, emphysema, DM, and HTN who presents ambulatory to the [facility] ED with cc of worsened cough and wheezing. Pt states she was discharged from the ED 3 days ago (02/27/16) and was given Prednisone and cough syrup in addition to her inhaler and nebulizer; however, her cough has become productive with brown, bloody sputum despite cessation of smoking since discharge. She reports vomiting throughout the day and night yesterday. She also reports generalized body aches and chills. Pt notes she has no current PCP because she is transitioning from an [employer] insurance to a [employer] after moving here a month ago.         Social Hx: - Tobacco (quit 3 days ago, was 1 PPD), - EtOH, - Illicit Drugs    PCP: PROVIDER UNKNOWN    There are no other complaints, changes or physical findings at this time.      The history is provided by the patient. No language interpreter was used.        Past Medical History:   Diagnosis Date   ??? Asthma    ??? Chronic obstructive pulmonary disease ([place])    ??? Hypertension        Past Surgical History:   Procedure Laterality Date   ??? HX ORTHOPAEDIC      back         No family history on file.    Social History     Social History   ??? Marital status: SINGLE     Spouse name: N/A   ??? Number of children: N/A   ??? Years of education: N/A     Occupational History   ??? Not on file.     Social History Main Topics   ??? Smoking status: Current Every Day Smoker     Packs/day: 1.00   ??? Smokeless tobacco: Never Used   ??? Alcohol use No   ??? Drug use: No   ??? Sexual activity: Not on file     Other Topics Concern   ??? Not on file     Social History Narrative         ALLERGIES: Review of patient's allergies indicates no known allergies.    Review of Systems   Constitutional: Negative for chills and fever.   HENT: Negative for congestion, rhinorrhea, sneezing and sore throat.     Eyes: Negative for redness and visual disturbance.   Respiratory: Positive for cough (with brown, bloody sputum), shortness of breath and wheezing.    Cardiovascular: Negative for leg swelling.   Gastrointestinal: Negative for abdominal pain, nausea and vomiting.   Genitourinary: Negative for difficulty urinating and frequency.   Musculoskeletal: Negative for back pain, myalgias and neck stiffness.   Skin: Negative for rash.   Neurological: Negative for dizziness, syncope, weakness and headaches.   Hematological: Negative for adenopathy.       Patient Vitals for the past 12 hrs:   Temp Pulse Resp BP SpO2   03/01/16 1247 99.5 ??F (37.5 ??C) (!) 57 30 147/70 93 %         Physical Exam   Constitutional: She is oriented to person, place, and time. She appears well-developed and well-nourished.   HENT:   Head: Normocephalic and atraumatic.   Mouth/Throat: Oropharynx is clear and moist.   Eyes: Conjunctivae and EOM are normal.   Neck: Normal range of motion and full passive range of motion without pain. Neck  supple.   Cardiovascular: Normal rate, regular rhythm, S1 normal, S2 normal, normal heart sounds, intact distal pulses and normal pulses.    No murmur heard.  Pulmonary/Chest: Tachypnea noted. She is in respiratory distress (moderate). She has wheezes (all lung fields).   Abdominal: Soft. Normal appearance and bowel sounds are normal. She exhibits no distension. There is no tenderness. There is no rebound.   Musculoskeletal: Normal range of motion.   Neurological: She is alert and oriented to person, place, and time. She has normal strength.   Skin: Skin is warm, dry and intact. No rash noted.   Psychiatric: She has a normal mood and affect. Her speech is normal and behavior is normal. Judgment and thought content normal.   Nursing note and vitals reviewed.       MDM  Number of Diagnoses or Management Options  Acute exacerbation of chronic obstructive pulmonary disease (COPD) ([place]):   Community acquired pneumonia:    Tobacco abuse:   Diagnosis management comments:   DDx: COPD exacerbation, bronchitis, PNA, chronic tobacco abuse       Amount and/or Complexity of Data Reviewed  Tests in the radiology section of CPT??: ordered and reviewed  Review and summarize past medical records: yes  Independent visualization of images, tracings, or specimens: yes    Critical Care  Total time providing critical care: 30-74 minutes    ED Course       Procedures      Discussed the risks of smoking and the benefits of smoking cessation as well as the long term sequelae of smoking with the pt. The patient verbalized their understanding.     2:49 PM  I re-examined the patient and evaluated the effectiveness of breathing treatment they received.  I feel that there has been some improvement, but also feel that the patient would benefit clinically from further breathing treatments.  I will evaluate the patient again after the next round of treatments.    3:32 PM  The patient has been examined after this most recent nebulizer breathing treatment.  They are showing some clinically improved aeration upon auscultation and some decreased work of breathing.  The patient also states that they are improving.  I will continue to closely monitor their vital signs and their progress as this treatment continues.     CRITICAL CARE NOTE :  3:53 PM  IMPENDING DETERIORATION -Airway and Respiratory  ASSOCIATED RISK FACTORS - Hypoxia  MANAGEMENT- Bedside Assessment and Supervision of Care  INTERPRETATION -  Xrays  INTERVENTIONS - Metobolic interventions  CASE REVIEW - Hospitalist and Nursing  TREATMENT RESPONSE -Improved  PERFORMED BY - Self  NOTES   :  I have spent 55 minutes of critical care time involved in lab review, consultations with specialist, family decision- making, bedside attention and documentation. During this entire length of time I was immediately available to the patient .      LABORATORY TESTS:  Recent Results (from the past 12 hour(s))    CBC WITH AUTOMATED DIFF    Collection Time: 03/01/16  2:40 PM   Result Value Ref Range    WBC 8.4 3.6 - 11.0 K/uL    RBC 4.91 3.80 - 5.20 M/uL    HGB 14.6 11.5 - 16.0 g/dL    HCT 43.8 35.0 - 47.0 %    MCV 89.2 80.0 - 99.0 FL    MCH 29.7 26.0 - 34.0 PG    MCHC 33.3 30.0 - 36.5 g/dL    RDW 13.9 11.5 -  14.5 %    PLATELET 268 150 - 400 K/uL    NEUTROPHILS 71 32 - 75 %    LYMPHOCYTES 22 12 - 49 %    MONOCYTES 7 5 - 13 %    EOSINOPHILS 0 0 - 7 %    BASOPHILS 0 0 - 1 %    ABS. NEUTROPHILS 5.9 1.8 - 8.0 K/UL    ABS. LYMPHOCYTES 1.9 0.8 - 3.5 K/UL    ABS. MONOCYTES 0.6 0.0 - 1.0 K/UL    ABS. EOSINOPHILS 0.0 0.0 - 0.4 K/UL    ABS. BASOPHILS 0.0 0.0 - 0.1 K/UL   METABOLIC PANEL, COMPREHENSIVE    Collection Time: 03/01/16  2:40 PM   Result Value Ref Range    Sodium 139 136 - 145 mmol/L    Potassium 3.2 (L) 3.5 - 5.1 mmol/L    Chloride 99 97 - 108 mmol/L    CO2 28 21 - 32 mmol/L    Anion gap 12 5 - 15 mmol/L    Glucose 220 (H) 65 - 100 mg/dL    BUN 11 6 - 20 MG/DL    Creatinine 0.99 0.55 - 1.02 MG/DL    BUN/Creatinine ratio 11 (L) 12 - 20      GFR est AA >60 >60 ml/min/1.45m    GFR est non-AA 58 (L) >60 ml/min/1.763m   Calcium 8.8 8.5 - 10.1 MG/DL    Bilirubin, total 0.4 0.2 - 1.0 MG/DL    ALT (SGPT) 19 12 - 78 U/L    AST (SGOT) 10 (L) 15 - 37 U/L    Alk. phosphatase 93 45 - 117 U/L    Protein, total 7.9 6.4 - 8.2 g/dL    Albumin 3.4 (L) 3.5 - 5.0 g/dL    Globulin 4.5 (H) 2.0 - 4.0 g/dL    A-G Ratio 0.8 (L) 1.1 - 2.2         IMAGING RESULTS:  CXR Results  (Last 48 hours)               03/01/16 1400  XR CHEST PORT Final result    Impression:  IMPRESSION:       Left basilar airspace opacity may represent infection. Follow-up to resolution   is recommended.           Narrative:  EXAM:  XR CHEST PORT       INDICATION:  Wheezing and cough for one week.       COMPARISON: 02/27/2016       TECHNIQUE: Portable AP upright chest view at 1345 hours       FINDINGS: The cardiomediastinal contours are stable. The pulmonary vasculature    is within normal limits.        There is left basilar airspace opacity. The right lung and pleural spaces are   clear. The bones and upper abdomen are stable.               MEDICATIONS GIVEN:  Medications   cefTRIAXone (ROCEPHIN) 2 g in 0.9% sodium chloride (MBP/ADV) 50 mL (not administered)   azithromycin (ZITHROMAX) 500 mg in 0.9% sodium chloride (MBP/ADV) 250 mL (500 mg IntraVENous New Bag 03/01/16 1533)   sodium chloride 0.9 % bolus infusion 1,000 mL (not administered)   albuterol-ipratropium (DUO-NEB) 2.5 MG-0.5 MG/3 ML (3 mL Nebulization Given 03/01/16 1258)   albuterol-ipratropium (DUO-NEB) 2.5 MG-0.5 MG/3 ML (3 mL Nebulization Given 03/01/16 1326)   albuterol-ipratropium (DUO-NEB) 2.5 MG-0.5 MG/3 ML (3 mL Nebulization Given 03/01/16 1514)  IMPRESSION:  1. Community acquired pneumonia    2. Tobacco abuse    3. Acute exacerbation of chronic obstructive pulmonary disease (COPD) ([place])        PLAN:  1. Admit to Hospitalist    ADMIT NOTE:  3:37 PM  Patient is being admitted to the hospital by Dr. Debbe Bales.  The results of their tests and reasons for their admission have been discussed with them and/or available family.  They convey agreement and understanding for the need to be admitted and for their admission diagnosis.  Consultation has been made with the inpatient physician specialist for hospitalization.    Attestation Note:  This note is prepared by Debbra Riding, acting as Scribe for Luther Hearing, MD.    Luther Hearing, MD: The scribe's documentation has been prepared under my direction and personally reviewed by me in its entirety. I confirm that the note above accurately reflects all work, treatment, procedures, and medical decision making performed by me.

## 2016-03-01 NOTE — Progress Notes (Signed)
Care manager interviewed patient at bedside. RRAT: 4.    The patient is a 55 year old female presenting to ED with wheezing. History of COPD, 5 ED visits within the past six months. The patient had a same day admission/discharge over the weekend, and returns to ED today with no relief of symptoms. [place] [place] [address] and phone number. She states that she recently moved to the area and is living with her aunt. She is working a job in [place] and does have transportation. She does not have a PCP here and notes that her insurance will not be active in [place] until August. CM recommended [facility] for PCP f/u, patient is agreeable to this. Will schedule appointment prior to discharge and continue to monitor for discharge planning.    Care Management Interventions  PCP Verified by CM: Yes (will be new patient at [facility])  Transition of Care Consult (CM Consult): Discharge Planning  Current Support Network: Relative's Home (with aunt)  Confirm Follow Up Transport: Self  Plan discussed with Pt/Family/Caregiver: Yes    Nicolasa Ducking, MSW  [phone]

## 2016-03-01 NOTE — Progress Notes (Signed)
1923: Bedside shift change report given to [facility] (Soil scientist) by Anner Crete (offgoing nurse). Report included the following information SBAR, Kardex, ED Summary, Intake/Output, MAR and Recent Results.   2123: Dr. Floy Sabina notified of [place] 358. MD stated she would put orders in computer.   0013: Dr. Floy Sabina notified of BP 182/92. No new orders given.   [id number]: Bedside shift change report given to Ashley Akin (oncoming nurse) by Alyse Low (offgoing nurse). Report included the following information SBAR, Kardex, ED Summary, Intake/Output, MAR and Recent Results.

## 2016-03-02 ENCOUNTER — Inpatient Hospital Stay: Admit: 2016-03-02 | Payer: MEDICAID | Primary: Family

## 2016-03-02 LAB — DRUG SCREEN, URINE
AMPHETAMINES: NEGATIVE
BARBITURATES: NEGATIVE
BENZODIAZEPINES: POSITIVE — AB
COCAINE: NEGATIVE
METHADONE: NEGATIVE
OPIATES: NEGATIVE
PCP(PHENCYCLIDINE): NEGATIVE
THC (TH-CANNABINOL): NEGATIVE

## 2016-03-02 LAB — CBC W/O DIFF
HCT: 42.4 % (ref 35.0–47.0)
HGB: 14 g/dL (ref 11.5–16.0)
MCH: 29.4 PG (ref 26.0–34.0)
MCHC: 33 g/dL (ref 30.0–36.5)
MCV: 89.1 FL (ref 80.0–99.0)
PLATELET: 274 10*3/uL (ref 150–400)
RBC: 4.76 M/uL (ref 3.80–5.20)
RDW: 13.9 % (ref 11.5–14.5)
WBC: 8.1 10*3/uL (ref 3.6–11.0)

## 2016-03-02 LAB — METABOLIC PANEL, BASIC
Anion gap: 11 mmol/L (ref 5–15)
BUN/Creatinine ratio: 15 (ref 12–20)
BUN: 11 MG/DL (ref 6–20)
CO2: 27 mmol/L (ref 21–32)
Calcium: 8.8 MG/DL (ref 8.5–10.1)
Chloride: 103 mmol/L (ref 97–108)
Creatinine: 0.74 MG/DL (ref 0.55–1.02)
GFR est AA: >60 mL/min/{1.73_m2} (ref >60)
GFR est non-AA: >60 mL/min/{1.73_m2} (ref >60)
Glucose: 157 mg/dL — ABNORMAL HIGH (ref 65–100)
Potassium: 3.7 mmol/L (ref 3.5–5.1)
Sodium: 141 mmol/L (ref 136–145)

## 2016-03-02 LAB — GLUCOSE, POC
Glucose (POC): 358 mg/dL — ABNORMAL HIGH (ref 65–100)
Glucose (POC): 111 mg/dL — ABNORMAL HIGH (ref 65–100)
Glucose (POC): 194 mg/dL — ABNORMAL HIGH (ref 65–100)
Glucose (POC): 194 mg/dL — ABNORMAL HIGH (ref 65–100)
Glucose (POC): 256 mg/dL — ABNORMAL HIGH (ref 65–100)

## 2016-03-02 MED ORDER — INSULIN LISPRO 100 UNIT/ML INJECTION
100 unit/mL | Freq: Once | SUBCUTANEOUS | Status: AC
Start: 2016-03-02 — End: 2016-03-01
  Administered 2016-03-02: 02:00:00 via SUBCUTANEOUS

## 2016-03-02 MED FILL — ALPRAZOLAM 1 MG TAB: 1 mg | ORAL | Qty: 2

## 2016-03-02 MED FILL — AZITHROMYCIN 500 MG IV SOLUTION: 500 mg | INTRAVENOUS | Qty: 5

## 2016-03-02 MED FILL — PREDNISONE 20 MG TAB: 20 mg | ORAL | Qty: 2

## 2016-03-02 MED FILL — ENOXAPARIN 40 MG/0.4 ML SUB-Q SYRINGE: 40 mg/0.4 mL | SUBCUTANEOUS | Qty: 0.4

## 2016-03-02 MED FILL — IPRATROPIUM-ALBUTEROL 2.5 MG-0.5 MG/3 ML NEB SOLUTION: 2.5 mg-0.5 mg/3 ml | RESPIRATORY_TRACT | Qty: 3

## 2016-03-02 MED FILL — CEFTRIAXONE 2 GRAM SOLUTION FOR INJECTION: 2 gram | INTRAMUSCULAR | Qty: 2

## 2016-03-02 MED FILL — MUCINEX DM 30 MG-600 MG TABLET,EXTENDED RELEASE 12 HR: 30-600 mg | ORAL | Qty: 1

## 2016-03-02 MED FILL — INSULIN LISPRO 100 UNIT/ML INJECTION: 100 unit/mL | SUBCUTANEOUS | Qty: 1

## 2016-03-02 MED FILL — BD POSIFLUSH NORMAL SALINE 0.9 % INJECTION SYRINGE: INTRAMUSCULAR | Qty: 10

## 2016-03-02 MED FILL — AMLODIPINE 5 MG TAB: 5 mg | ORAL | Qty: 1

## 2016-03-02 MED FILL — CLONIDINE 0.1 MG TAB: 0.1 mg | ORAL | Qty: 1

## 2016-03-02 MED FILL — LISINOPRIL 20 MG TAB: 20 mg | ORAL | Qty: 1

## 2016-03-02 MED FILL — GLIPIZIDE 5 MG TAB: 5 mg | ORAL | Qty: 1

## 2016-03-02 NOTE — Progress Notes (Signed)
0710hrs .Marland KitchenBedside and Verbal shift change report given to [employer] Games developer) by Cristy(offgoing nurse). Report included the following information SBAR, Kardex, Intake/Output, MAR, Recent Results and Med Rec Status.    1910hrs .Marland KitchenBedside and Verbal shift change report given to Claiborne Billings (Soil scientist) by Learta Codding (offgoing nurse). Report included the following information SBAR, Kardex, Intake/Output, MAR, Recent Results and Med Rec Status.

## 2016-03-02 NOTE — Progress Notes (Signed)
Spiritual Care Partner Volunteer visited patient in Med Surg/Tele unit on 03/02/16.  Documented by:  Ashley Mariner Anderson [phone])

## 2016-03-02 NOTE — Progress Notes (Addendum)
1955: Bedside and Verbal shift change report given to [employer] (oncoming nurse) by Learta Codding RN (offgoing nurse). Report included the following information SBAR.     0100-0500 Unexpected [facility] Downtime

## 2016-03-02 NOTE — Other (Signed)
CM rounded with IDT and discussed patient's care. During rounds patient presented verbally agressive. CM scheduled patient's follow up PCP appointment with NP [place] for 03/08/16 at 8:30am.    [facility], [place]

## 2016-03-02 NOTE — Progress Notes (Signed)
Plan of care discussed during IDRs. Pt became very upset when her admission criteria reviewed with nurse during IDR. She says " she will never come back to this hospital because we keep discharging her without treating her." tried to calm her down and told her, " I will come back to discuss further plans with her", then she walked out of room yelling and cursing. Will call patient's advocate to address patient's concerns.

## 2016-03-02 NOTE — Progress Notes (Signed)
Hospitalist Progress Note    NAME: Kathy Bush   DOB:  06-21-1961   MRN:  [id number]       Assessment / Plan:    Community acquired pneumonia POA  CXR-Left basilar airspace opacity may represent infection  No fever, no leukocytosis, no hypoxia or any other s/s of sepsis, tolerating po diet  doesnot meet criteria for inpatient, will change status to observation  -follow blood cx  -on empiric IV Ceftriaxone and Azithromycin  urine for  pneumococcal and legionella Ag  ??  COPD with acute exacerbation ([place]): POA   Likely secondary to Left basilar Pneumonia  -on duonebs/ Steroids/iv abx  ??  IDDM: POA  continue glipizide, jardiance  ISS  ??  Essential hypertension: POA  On amlodipine and lisinopril  ??  Hypokalemia: POA  Replaced  ??  Concern has benzo dependence and mood disorder  Aggressive behavior     Has an episode of aggression this morning, when she was yelling in hallways, security and patient's advocate called, appreciate their assistance    Tobacco dependence  Nicotine patch offered  counseled extensively ??    obesity  Body mass index is 36.14 kg/(m^2).  ??  Code Status: full  Surrogate Decision Maker: patient competent  ??  DVT Prophylaxis: Lovenox  GI Prophylaxis: not indicated  ??  Baseline: functional, tobacco dependence         Subjective:     Chief Complaint / Reason for Physician Visit  "Lying comfortable, off oxygen".  Discussed with RN events overnight.     Review of Systems:  Symptom Y/N Comments  Symptom Y/N Comments   Fever/Chills n   Chest Pain n    Poor Appetite n   Edema n    Cough y   Abdominal Pain n    Sputum y   Joint Pain n    SOB/DOE n   Pruritis/Rash n    Nausea/vomit n   Tolerating PT/OT y    Diarrhea n   Tolerating Diet y    Constipation    Other       Could NOT obtain due to:      Objective:     VITALS:   Last 24hrs VS reviewed since prior progress note. Most recent are:  Patient Vitals for the past 24 hrs:   Temp Pulse Resp BP SpO2    03/02/16 1138 97.9 ??F (36.6 ??C) 84 18 (!) 179/97 97 %   03/02/16 1001 - 82 - (!) 154/96 94 %   03/02/16 0758 97.9 ??F (36.6 ??C) 81 18 (!) 189/104 -   03/02/16 0520 - - - 180/88 -   03/02/16 0513 97.9 ??F (36.6 ??C) 80 19 (!) 190/105 95 %   03/02/16 0005 - 97 - (!) 182/92 -   03/01/16 2146 - 91 - (!) 196/97 -   03/01/16 1925 98.4 ??F (36.9 ??C) 84 18 154/87 93 %   03/01/16 1829 98.5 ??F (36.9 ??C) - - - -   03/01/16 1653 100.1 ??F (37.8 ??C) 94 20 142/63 94 %   03/01/16 1626 - 64 22 145/84 95 %   03/01/16 1440 - 66 24 146/79 95 %   03/01/16 1247 99.5 ??F (37.5 ??C) (!) 57 30 147/70 93 %       Intake/Output Summary (Last 24 hours) at 03/02/16 1206  Last data filed at 03/02/16 0650   Gross per 24 hour   Intake  300 ml   Output              250 ml   Net               50 ml        PHYSICAL EXAM:  General: WD, WN. Alert, cooperative, no acute distress????  EENT:  EOMI. Anicteric sclerae. MMM  Resp:  CTA bilaterally, no wheezing or rales.  No accessory muscle use  CV:  Regular  rhythm,?? No edema  GI:  Soft, Non distended, Non tender. ??+Bowel sounds  Neurologic:?? Alert and oriented X 3, normal speech,   Psych:???? Good insight.??Not anxious nor agitated  Skin:  No rashes.  No jaundice    Reviewed most current lab test results and cultures  YES  Reviewed most current radiology test results   YES  Review and summation of old records today    NO  Reviewed patient's current orders and MAR    YES  PMH/SH reviewed - no change compared to H&P  ________________________________________________________________________  Care Plan discussed with:    Comments   Patient x    Family      RN x    Care Manager     Consultant                        Multidiciplinary team rounds were held today with case manager, nursing, pharmacist and Occupational psychologist.  Patient's plan of care was discussed; medications were reviewed and discharge planning was addressed.     ________________________________________________________________________   Total NON critical care TIME:  35   Minutes    Total CRITICAL CARE TIME Spent:   Minutes non procedure based      Comments   >50% of visit spent in counseling and coordination of care     ________________________________________________________________________  Jac Canavan, MD     Procedures: see electronic medical records for all procedures/Xrays and details which were not copied into this note but were reviewed prior to creation of Plan.      LABS:  I reviewed today's most current labs and imaging studies.  Pertinent labs include:  Recent Labs      03/02/16   0525  03/01/16   1440   WBC  8.1  8.4   HGB  14.0  14.6   HCT  42.4  43.8   PLT  274  268     Recent Labs      03/02/16   0525  03/01/16   1440   NA  141  139   K  3.7  3.2*   CL  103  99   CO2  27  28   GLU  157*  220*   BUN  11  11   CREA  0.74  0.99   CA  8.8  8.8   ALB   --   3.4*   TBILI   --   0.4   SGOT   --   10*   ALT   --   19       Signed: Jac Canavan, MD

## 2016-03-03 LAB — GLUCOSE, POC
Glucose (POC): 218 mg/dL — ABNORMAL HIGH (ref 65–100)
Glucose (POC): 175 mg/dL — ABNORMAL HIGH (ref 65–100)

## 2016-03-03 LAB — S.PNEUMO AG, UR/CSF: Streptococcus pneumoniae Ag: POSITIVE — AB

## 2016-03-03 LAB — LEGIONELLA PNEUMOPHILA AG, URINE: L pneumophila S1 Ag, urine: NEGATIVE

## 2016-03-03 MED ORDER — EMPAGLIFLOZIN 25 MG TABLET
25 mg | ORAL_TABLET | Freq: Every day | ORAL | 1 refills | Status: DC
Start: 2016-03-03 — End: 2016-04-21

## 2016-03-03 MED ORDER — PREDNISONE 20 MG TAB
20 mg | ORAL_TABLET | Freq: Every day | ORAL | 0 refills | Status: AC
Start: 2016-03-03 — End: 2016-03-08

## 2016-03-03 MED ORDER — ALBUTEROL SULFATE HFA 90 MCG/ACTUATION AEROSOL INHALER
90 mcg/actuation | RESPIRATORY_TRACT | 1 refills | Status: AC | PRN
Start: 2016-03-03 — End: ?

## 2016-03-03 MED ORDER — LEVOFLOXACIN 750 MG TAB
750 mg | ORAL_TABLET | Freq: Every day | ORAL | 0 refills | Status: AC
Start: 2016-03-03 — End: 2016-03-08

## 2016-03-03 MED ORDER — GUAIFENESIN 600 MG TABLET,EXTENDED RELEASE BIPHASIC 12 HR
600 mg | ORAL_TABLET | Freq: Two times a day (BID) | ORAL | 0 refills | Status: DC
Start: 2016-03-03 — End: 2016-03-16

## 2016-03-03 MED ORDER — AMLODIPINE 5 MG TAB
5 mg | ORAL_TABLET | Freq: Every day | ORAL | 1 refills | Status: DC
Start: 2016-03-03 — End: 2016-04-21

## 2016-03-03 MED ORDER — NYSTATIN 100,000 UNIT/ML ORAL SUSP
100000 unit/mL | Freq: Four times a day (QID) | ORAL | 0 refills | Status: DC
Start: 2016-03-03 — End: 2016-03-16

## 2016-03-03 MED FILL — BD POSIFLUSH NORMAL SALINE 0.9 % INJECTION SYRINGE: INTRAMUSCULAR | Qty: 10

## 2016-03-03 MED FILL — INSULIN LISPRO 100 UNIT/ML INJECTION: 100 unit/mL | SUBCUTANEOUS | Qty: 1

## 2016-03-03 MED FILL — IPRATROPIUM-ALBUTEROL 2.5 MG-0.5 MG/3 ML NEB SOLUTION: 2.5 mg-0.5 mg/3 ml | RESPIRATORY_TRACT | Qty: 3

## 2016-03-03 MED FILL — PREDNISONE 20 MG TAB: 20 mg | ORAL | Qty: 2

## 2016-03-03 MED FILL — AMLODIPINE 5 MG TAB: 5 mg | ORAL | Qty: 1

## 2016-03-03 MED FILL — LISINOPRIL 20 MG TAB: 20 mg | ORAL | Qty: 1

## 2016-03-03 MED FILL — CLONIDINE 0.1 MG TAB: 0.1 mg | ORAL | Qty: 1

## 2016-03-03 MED FILL — ALPRAZOLAM 1 MG TAB: 1 mg | ORAL | Qty: 2

## 2016-03-03 MED FILL — GLIPIZIDE 5 MG TAB: 5 mg | ORAL | Qty: 1

## 2016-03-03 MED FILL — MUCINEX DM 30 MG-600 MG TABLET,EXTENDED RELEASE 12 HR: 30-600 mg | ORAL | Qty: 1

## 2016-03-03 NOTE — Discharge Summary (Signed)
Discharge Summary by Jac Canavan, MD at 03/03/16 [phone]                Author: Jac Canavan, MD  Service: Internal Medicine  Author Type: Physician       Filed: 03/03/16 1322  Date of Service: 03/03/16 0909  Status: Signed          Editor: Jac Canavan, MD (Physician)                                       Hospitalist Discharge Summary        Patient ID:   Kathy Bush   [id number]   55 y.o.   07/09/1961      PCP on record: PROVIDER UNKNOWN      Admit date: 03/01/2016   Discharge date and time: 03/03/2016         DISCHARGE DIAGNOSIS:      Community acquired pneumonia   COPD with acute exacerbation ([place])   Likely secondary to Left basilar Pneumonia   IDDM ????   Essential hypertension   Hypokalemia????   Concern has benzo dependence and mood disorder   Aggressive behavior   Tobacco dependence   obesity   Body mass index is 36.14 kg/(m^2).   ????      CONSULTATIONS:   IP CONSULT TO HOSPITALIST      Excerpted HPI from H&P of Jac Canavan, MD:   Kathy Bush is a 55 y.o.  African American female with PMH of COPD,DM2,Tobacco abuse who presents to ED with c/o cough,SOB,Wheezing.patient presents today with  increased shortness of breath ,cough which is more productive and wheezing . She was discharged on 02/27/16 with prescription for prednisone, cough syrup and inhalers ,however  her symptoms got worse past two days. She states she has`t smoked past 3  days.She reports generalized body aches, cough which is productive with blood tinged sputum and low-grade fevers at home.    In ED,Chest x-ray was done - suspicious for left  basilar opacity.blood /sputum culture sent.IV antibiotics and fluids administered.   ??   We were asked to admit for work up and evaluation of the above problems.    ??      ______________________________________________________________________   DISCHARGE SUMMARY/HOSPITAL COURSE:   for full details see H&P, daily progress notes, labs, consult notes.       Issues upon  discharge:    -pt asked to get prescriptions for benzodiazepine and narcotics, refused because could not find out who is her primary prescribes for these medication   -initially asked to go back to work on 7/21, then changed date to 7/24 and then wanted to change to 7/25 ( work note provided with return date 7/24)   -asked to provide reason in work note for her inability to work. politely denied having capacity to do it, when I have seen her only once in inpatient setting.    -pt was not happy with care that has been provided during this hospitalization      Community acquired pneumonia POA   CXR-Left basilar airspace opacity may represent infection   No fever, no leukocytosis, no hypoxia or any other s/s of sepsis, tolerating po diet   doesnot meet criteria for inpatient, admitted as observation   -blood cx NGTD   -treated with 2 days of Ceftriaxone and Azithromycin and will discharge on po levaquin   -??pneumococcal urine Ag-  neg and urine legionella ag pending   ????   COPD with acute exacerbation ([place]): POA    Likely secondary to Left basilar Pneumonia   -treated with on duonebs, Steroids, abx   ????   IDDM: POA   continue glipizide, jardiance   ISS   ????   Essential hypertension: POA   On amlodipine and lisinopril   ????   Hypokalemia: POA   Replaced   ????   Concern has benzo dependence and mood disorder   Aggressive behavior      Tobacco dependence   Nicotine patch offered   counseled extensively ??   ??   obesity   Body mass index is 36.14 kg/(m^2).   ????   Code Status: full   Surrogate Decision Maker: patient competent   ????   DVT Prophylaxis: Lovenox   GI Prophylaxis: not indicated   ????   Baseline: functional, tobacco dependence            _______________________________________________________________________   Patient seen and examined by me on discharge day.   Pertinent Findings:   Gen:    Not in distress   Chest: Clear lungs   CVS:   Regular rhythm.  No edema   Abd:  Soft, not distended, not tender   Neuro:  Alert,  cn 2-12 grossly intact   _______________________________________________________________________   DISCHARGE MEDICATIONS:      Current Discharge Medication List              START taking these medications          Details        levoFLOXacin (LEVAQUIN) 750 mg tablet  Take 1 Tab by mouth daily for 5 days.   Qty: 5 Tab, Refills:  0               nystatin (MYCOSTATIN) 100,000 unit/mL suspension  Take 5 mL by mouth four (4) times daily. swish and spit   Qty: 473 mL, Refills:  0                     CONTINUE these medications which have CHANGED          Details        albuterol (PROVENTIL HFA, VENTOLIN HFA, PROAIR HFA) 90 mcg/actuation inhaler  Take 1-2 Puffs by inhalation every four (4) hours as needed for Wheezing.   Qty: 1 Inhaler, Refills:  1               amLODIPine (NORVASC) 5 mg tablet  Take 1 Tab by mouth daily.   Qty: 30 Tab, Refills:  1               empagliflozin (JARDIANCE) 25 mg tablet  Take 1 Tab by mouth daily.   Qty: 30 Tab, Refills:  1               predniSONE (DELTASONE) 20 mg tablet  Take 2 Tabs by mouth daily (with breakfast) for 5 days.   Qty: 10 Tab, Refills:  0                     CONTINUE these medications which have NOT CHANGED          Details        hydrocortisone (CORTAID) 1 % topical cream  use thin layer to affected areas   Qty: 30 g, Refills:  0  guaiFENesin-dextromethorphan (GUAIFENESIN-DM) 10-100 mg/5 mL liqd  Take 10 mL by mouth every four (4) hours as needed. Indications: COUGH   Qty: 1 Bottle, Refills:  0               cloNIDine HCl (CATAPRES) 0.1 mg tablet  Take 0.1 mg by mouth nightly.               ibuprofen (MOTRIN) 800 mg tablet  Take 800 mg by mouth every eight (8) hours as needed for Pain.               ALPRAZolam (XANAX) 2 mg tablet  Take 2 mg by mouth nightly as needed for Anxiety. Indications: anxiety               fluticasone-vilanterol (BREO ELLIPTA) 200-25 mcg/dose inhaler  Take 1 Puff by inhalation daily.   Qty: 28 Each, Refills:  0                HYDROcodone-acetaminophen (XODOL) 7.5-300 mg tablet  Take 1 Tab by mouth as needed.               lisinopril (PRINIVIL, ZESTRIL) 20 mg tablet  Take 20 mg by mouth daily.               glipiZIDE (GLUCOTROL) 5 mg tablet  Take 5 mg by mouth daily.               hydrOXYzine HCl (ATARAX) 50 mg tablet  Take 1 Tab by mouth every six (6) hours as needed for Itching.   Qty: 20 Tab, Refills:  0                         My Recommended Diet, Activity, Wound Care, and follow-up labs are listed in the patient's Discharge Insturctions which I have personally completed and reviewed.      _______________________________________________________________________      DISPOSITION:          Home with Family:  X        Home with HH/PT/OT/RN:          SNF/LTC:          SAHR:          OTHER:             Condition at Discharge:  Stable   _______________________________________________________________________   Follow up with:    PCP : PROVIDER UNKNOWN     Follow-up Information        Follow up With  Details  Comments  [facility]  Go on 03/08/2016  Your appointment is scheduled for 03/08/16 at 8:30am with Nurse Practitioner [place]. Please arrive approximately 30 minutes early to complete  new patient paperwork.  [place]   [phone]             Provider Unknown      Patient not available to ask                         Total time in minutes spent coordinating this discharge (includes going over instructions, follow-up, prescriptions, and preparing report for sign off to her PCP) :  40 minutes      Signed:   Jac Canavan, MD

## 2016-03-03 NOTE — Discharge Summary (Signed)
Discharge Summary by Jac Canavan, MD at 03/03/16 [phone]                Author: Jac Canavan, MD  Service: Internal Medicine  Author Type: Physician       Filed: 03/03/16 0907  Date of Service: 03/03/16 0904  Status: Signed          Editor: Jac Canavan, MD (Physician)                     [place].            03/03/2016         RE: [name]         To Whom it May Concern:        This is to certify that Kathy Bush was admitted to hospital on  03/01/2016 and discharged on 03/03/2016.  She may return to work on 03/08/2016.          Thank you for your assistance in this matter.      Sincerely,         Jac Canavan, MD

## 2016-03-03 NOTE — Progress Notes (Signed)
Care Management Interventions  PCP Verified by CM: Yes (will be new patient at [facility])  Palliative Care Consult (Criteria: CHF and RRAT>21): No  Reason for No Palliative Care Consult:  (Patient will arrange)  Mode of Transport at Discharge: Other (see comment) (Patient will arrange)  Transition of Care Consult (CM Consult): Discharge Planning  Discharge Durable Medical Equipment: No  Physical Therapy Consult: No  Occupational Therapy Consult: No  Current Support Network: Relative's Home (with aunt)  Confirm Follow Up Transport: Self  Plan discussed with Pt/Family/Caregiver: Yes  Discharge Location  Discharge Placement: Home with outpatient services    Patient will follow up with [facility] on 03/08/16 at 8:30pm with NP [place].    [place], [place]

## 2016-03-03 NOTE — Progress Notes (Addendum)
0700) Bedside shift change report given to Nira Conn, RN (oncoming nurse) by Claiborne Billings, RN (offgoing nurse). Report included the following information SBAR, Kardex, MAR, Accordion and Recent Results.   [id number] BG 175  1030) Consult Dr. Otis Peak for cough pill.  1045) .Marland KitchenReviewed discharge instructions with pt including follow-up appointments, new medications and side effects, medications to continue, COPD education, and MyChart information.  Pt expressed understanding. IV was removed.  36) Consult Dr. Otis Peak for work note.  Pt able to return 03/08/16  1120) Pt refused ride, stated that she was able to drive home.

## 2016-03-03 NOTE — Discharge Summary (Signed)
[place].       03/03/2016      RE: Shakiela Minkoff      To Whom it May Concern:      This is to certify that Kathy Bush was admitted to hospital on 03/01/2016 and discharged on 03/03/2016.  She may return to work on 03/08/2016.       Thank you for your assistance in this matter.    Sincerely,      Jac Canavan, MD

## 2016-03-03 NOTE — Progress Notes (Signed)
Critical result received from [facility] lab for positive Strep-Pneumo AG. Relayed info to Dr. Otis Peak. No new orders, patient was being treated as inpatient and discharged on Levaquin x 7 days.

## 2016-03-03 NOTE — Discharge Summary (Signed)
Hospitalist Discharge Summary     Patient ID:  Kathy Bush  [id number]  55 y.o.  [birth year]    PCP on record: PROVIDER UNKNOWN    Admit date: 03/01/2016  Discharge date and time: 03/03/2016      DISCHARGE DIAGNOSIS:    Community acquired pneumonia  COPD with acute exacerbation ([place])  Likely secondary to Left basilar Pneumonia  IDDM ????  Essential hypertension  Hypokalemia????  Concern has benzo dependence and mood disorder  Aggressive behavior  Tobacco dependence  obesity  Body mass index is 36.14 kg/(m^2).  ????    CONSULTATIONS:  IP CONSULT TO HOSPITALIST    Excerpted HPI from H&P of Jac Canavan, MD:  Kathy Bush is a 55 y.o.  African American female with PMH of COPD,DM2,Tobacco abuse who presents to ED with c/o cough,SOB,Wheezing.patient presents today with increased shortness of breath ,cough which is more productive and wheezing . She was discharged on 02/27/16 with prescription for prednisone, cough syrup and inhalers ,however  her symptoms got worse past two days. She states she has`t smoked past 3 days.She reports generalized body aches, cough which is productive with blood tinged sputum and low-grade fevers at home.   In ED,Chest x-ray was done - suspicious for left  basilar opacity.blood /sputum culture sent.IV antibiotics and fluids administered.  ??  We were asked to admit for work up and evaluation of the above problems.   ??    ______________________________________________________________________  DISCHARGE SUMMARY/HOSPITAL COURSE:  for full details see H&P, daily progress notes, labs, consult notes.     Issues upon discharge:   -pt asked to get prescriptions for benzodiazepine and narcotics, refused because could not find out who is her primary prescribes for these medication  -initially asked to go back to work on 7/21, then changed date to 7/24 and then wanted to change to 7/25 ( work note provided with return date 7/24)   -asked to provide reason in work note for her inability to work. politely denied having capacity to do it, when I have seen her only once in inpatient setting.   -pt was not happy with care that has been provided during this hospitalization    Community acquired pneumonia POA  CXR-Left basilar airspace opacity may represent infection  No fever, no leukocytosis, no hypoxia or any other s/s of sepsis, tolerating po diet  doesnot meet criteria for inpatient, admitted as observation  -blood cx NGTD  -treated with 2 days of Ceftriaxone and Azithromycin and will discharge on po levaquin  -??pneumococcal urine Ag- neg and urine legionella ag pending  ????  COPD with acute exacerbation ([place]): POA   Likely secondary to Left basilar Pneumonia  -treated with on duonebs, Steroids, abx  ????  IDDM: POA  continue glipizide, jardiance  ISS  ????  Essential hypertension: POA  On amlodipine and lisinopril  ????  Hypokalemia: POA  Replaced  ????  Concern has benzo dependence and mood disorder  Aggressive behavior    Tobacco dependence  Nicotine patch offered  counseled extensively ??  ??  obesity  Body mass index is 36.14 kg/(m^2).  ????  Code Status: full  Surrogate Decision Maker: patient competent  ????  DVT Prophylaxis: Lovenox  GI Prophylaxis: not indicated  ????  Baseline: functional, tobacco dependence        _______________________________________________________________________  Patient seen and examined by me on discharge day.  Pertinent Findings:  Gen:    Not in distress  Chest: Clear lungs  CVS:   Regular  rhythm.  No edema  Abd:  Soft, not distended, not tender  Neuro:  Alert, cn 2-12 grossly intact  _______________________________________________________________________  DISCHARGE MEDICATIONS:   Current Discharge Medication List      START taking these medications    Details   levoFLOXacin (LEVAQUIN) 750 mg tablet Take 1 Tab by mouth daily for 5 days.  Qty: 5 Tab, Refills: 0       nystatin (MYCOSTATIN) 100,000 unit/mL suspension Take 5 mL by mouth four (4) times daily. swish and spit  Qty: 473 mL, Refills: 0         CONTINUE these medications which have CHANGED    Details   albuterol (PROVENTIL HFA, VENTOLIN HFA, PROAIR HFA) 90 mcg/actuation inhaler Take 1-2 Puffs by inhalation every four (4) hours as needed for Wheezing.  Qty: 1 Inhaler, Refills: 1      amLODIPine (NORVASC) 5 mg tablet Take 1 Tab by mouth daily.  Qty: 30 Tab, Refills: 1      empagliflozin (JARDIANCE) 25 mg tablet Take 1 Tab by mouth daily.  Qty: 30 Tab, Refills: 1      predniSONE (DELTASONE) 20 mg tablet Take 2 Tabs by mouth daily (with breakfast) for 5 days.  Qty: 10 Tab, Refills: 0         CONTINUE these medications which have NOT CHANGED    Details   hydrocortisone (CORTAID) 1 % topical cream use thin layer to affected areas  Qty: 30 g, Refills: 0      guaiFENesin-dextromethorphan (GUAIFENESIN-DM) 10-100 mg/5 mL liqd Take 10 mL by mouth every four (4) hours as needed. Indications: COUGH  Qty: 1 Bottle, Refills: 0      cloNIDine HCl (CATAPRES) 0.1 mg tablet Take 0.1 mg by mouth nightly.      ibuprofen (MOTRIN) 800 mg tablet Take 800 mg by mouth every eight (8) hours as needed for Pain.      ALPRAZolam (XANAX) 2 mg tablet Take 2 mg by mouth nightly as needed for Anxiety. Indications: anxiety      fluticasone-vilanterol (BREO ELLIPTA) 200-25 mcg/dose inhaler Take 1 Puff by inhalation daily.  Qty: 28 Each, Refills: 0      HYDROcodone-acetaminophen (XODOL) 7.5-300 mg tablet Take 1 Tab by mouth as needed.      lisinopril (PRINIVIL, ZESTRIL) 20 mg tablet Take 20 mg by mouth daily.      glipiZIDE (GLUCOTROL) 5 mg tablet Take 5 mg by mouth daily.      hydrOXYzine HCl (ATARAX) 50 mg tablet Take 1 Tab by mouth every six (6) hours as needed for Itching.  Qty: 20 Tab, Refills: 0             My Recommended Diet, Activity, Wound Care, and follow-up labs are listed in the patient's Discharge Insturctions which I have personally completed  and reviewed.    _______________________________________________________________________  DISPOSITION:    Home with Family: X   Home with HH/PT/OT/RN:    SNF/LTC:    SAHR:    OTHER:        Condition at Discharge:  Stable  _______________________________________________________________________  Follow up with:   PCP : PROVIDER UNKNOWN  Follow-up Information     Follow up With Details Comments [facility] Go on 03/08/2016 Your appointment is scheduled for 03/08/16 at 8:30am with Nurse Practitioner [place]. Please arrive approximately 30 minutes early to complete new patient paperwork. [place]  [phone]  Provider Unknown   Patient not available to ask                Total time in minutes spent coordinating this discharge (includes going over instructions, follow-up, prescriptions, and preparing report for sign off to her PCP) :  40 minutes    Signed:  Jac Canavan, MD

## 2016-03-04 NOTE — Telephone Encounter (Signed)
Care manager called patient to follow up. Unable to reach, left voicemail.     Nicolasa Ducking, MSW  [phone]

## 2016-03-06 LAB — CULTURE, BLOOD, PAIRED: Culture result:: NO GROWTH

## 2016-03-08 ENCOUNTER — Encounter: Attending: Family | Primary: Family

## 2016-03-09 ENCOUNTER — Ambulatory Visit: Admit: 2016-03-09 | Discharge: 2016-03-09 | Attending: Family | Primary: Family

## 2016-03-09 DIAGNOSIS — J441 Chronic obstructive pulmonary disease with (acute) exacerbation: Secondary | ICD-10-CM

## 2016-03-09 LAB — AMB POC HEMOGLOBIN A1C: Hemoglobin A1c (POC): 10.2 %

## 2016-03-09 MED ORDER — ALPRAZOLAM 1 MG TAB
1 mg | ORAL_TABLET | Freq: Every day | ORAL | 0 refills | Status: DC | PRN
Start: 2016-03-09 — End: 2016-04-06

## 2016-03-09 MED ORDER — CANAGLIFLOZIN 100 MG TABLET
100 mg | ORAL_TABLET | Freq: Every day | ORAL | 2 refills | Status: DC
Start: 2016-03-09 — End: 2016-04-21

## 2016-03-09 NOTE — Progress Notes (Signed)
Chief Complaint   Patient presents with   ??? Hospital Follow Up     post hospital follow up for pneumonia.      1. Have you been to the ER, urgent care clinic since your last visit?  Hospitalized since your last visit?No    2. Have you seen or consulted any other health care providers outside of the [facility] since your last visit?  Include any pap smears or colon screening. No

## 2016-03-09 NOTE — Progress Notes (Signed)
Subjective:      Kathy Bush is a 55 y.o. female who is a new patient and is here to establish care and discuss recent hospitilization.   Previous followed by MD [place], [place].  The following sections were reviewed & updated as appropriate: PMH, PL, PSH, FH, RxH, and SH.  History of COPD, chronic pain, anxiety, diabetes.    Pneumonia follow up: she states she was admitted to the [facility] hospital on 03/01/16 for pneumonia, discharge 03/03/16. She states she is not feeling well. She is vomiting from the prednisone, she states she stopped taking this and stopped vomiting. She has nasal congestion and post nasal drip. She denies fevers. She states her cough has improved, her mucous color is improving.     Endocrine Review  She is seen for diabetes.  Since last visit she reports: no polyuria or polydipsia, no chest pain or dyspnea, no chest pain, dyspnea or TIA's, no numbness, tingling or pain in extremities, no unusual visual symptoms, no hypoglycemia, no medication side effects noted, no significant changes.  Home glucose monitoring: is performed regularly  She is checking her sugars once per day.  She reports medication compliance: she states her insurance will not cover jardiance.  Medication side effects: she does not tolerate metformin.   Lab review: no lab studies available for review at time of visit.  A1c today is 10.2    No results found for: HBA1C, HBA1CEXT, GLU, CREA, MCREA, CREAPOC, CREATEXT, MALBUR, MCALPOCT, MCACRPOC, MCACR, MCAU, MCAU2, MALBEXT, CHOL, HDL, LDLC, LDL, LDLCEXT, TRIGL, HBA1CEXT    No Diabetic HM Topics for this patient     Mental Health Review  Patient is seen for anxiety disorder.  She is on 2mg  xanax and she states she needs refills today. She states she is manic depression, bipolar. Ongoing symptoms include: racing thoughts, feelings of losing control, insomnia.  She denies: SI/SA.  Reported side effects from the treatment:  none.  She states understanding that we will not be managing this condition and she needs to follow up with psychiatry. She has a history of trying to kill herself at 12 years.      Patient Active Problem List    Diagnosis Date Noted   ??? Anxiety 03/09/2016   ??? History of pneumonia 03/09/2016   ??? Chronic obstructive pulmonary disease with acute exacerbation ([place]) 03/09/2016     Current Outpatient Prescriptions   Medication Sig Dispense Refill   ??? albuterol (PROVENTIL HFA, VENTOLIN HFA, PROAIR HFA) 90 mcg/actuation inhaler Take  by inhalation.     ??? cloNIDine HCl (CATAPRES) 0.1 mg tablet Take  by mouth two (2) times a day.     ??? fluticasone-vilanterol (BREO ELLIPTA) 200-25 mcg/dose inhaler Take 1 Puff by inhalation daily.     ??? glipiZIDE (GLUCOTROL) 5 mg tablet Take  by mouth two (2) times a day.     ??? HYDROcodone-acetaminophen (XODOL) 7.5-300 mg tablet Take  by mouth.     ??? hydrocortisone (CORTAID) 1 % topical cream Apply  to affected area two (2) times a day. use thin layer     ??? hydrOXYzine HCl (ATARAX) 50 mg tablet Take 50 mg by mouth three (3) times daily as needed for Itching.     ??? ibuprofen (MOTRIN) 800 mg tablet Take  by mouth.     ??? lisinopril (PRINIVIL, ZESTRIL) 20 mg tablet Take  by mouth daily.     ??? ALPRAZolam (XANAX) 2 mg tablet Take  by mouth.  No Known Allergies  Past Medical History:   Diagnosis Date   ??? COPD (chronic obstructive pulmonary disease) ([place])    ??? Diabetes ([place])    ??? Emphysema/COPD ([place])    ??? Hypertension      Family History   Problem Relation Age of Onset   ??? Cancer Mother      intestinal   ??? Heart Disease Father    ??? Hypertension Father      Social History   Substance Use Topics   ??? Smoking status: Never Smoker   ??? Smokeless tobacco: Never Used   ??? Alcohol use No        Review of Systems    A comprehensive review of systems was negative except for that written in the HPI.     Objective:     Visit Vitals   ??? BP 157/79 (BP 1 Location: Left arm, BP Patient Position: Sitting)    ??? Pulse 77   ??? Temp 99.1 ??F (37.3 ??C) (Oral)   ??? Resp 18   ??? Ht 5' 2.01" (1.575 m)   ??? Wt 196 lb (88.9 kg)   ??? SpO2 96%   ??? BMI 35.84 kg/m2     General appearance: alert, cooperative, no distress, appears stated age  Head: Normocephalic, without obvious abnormality, atraumatic  Eyes: negative  Ears: abnormal TM AD - serous middle ear fluid, abnormal TM AS - serous middle ear fluid  Nose: Nares normal. Septum midline. Mucosa normal. No drainage or sinus tenderness.  Throat: Lips, mucosa, and tongue normal. Poor dentition  Neck: supple, symmetrical, trachea midline and no adenopathy  Back: symmetric, no curvature. ROM normal. No CVA tenderness.  Lungs: clear to auscultation bilaterally  Heart: regular rate and rhythm, S1, S2 normal, no murmur, click, rub or gallop  Extremities: extremities normal, atraumatic, no cyanosis or edema  Pulses: 2+ and symmetric  Skin: Skin color, texture, turgor normal. No rashes or lesions  Neurologic: Grossly normal  Psych: tearful  Nursing note and vitals reviewed  Assessment/Plan:       ICD-10-CM ICD-9-CM    1. Chronic obstructive pulmonary disease with acute exacerbation (HCC) J44.1 491.21 REFERRAL TO PULMONARY DISEASE   2. Uncontrolled type 2 diabetes mellitus without complication, without long-term current use of insulin (HCC) E11.65 250.02 glipiZIDE (GLUCOTROL) 5 mg tablet      AMB POC HEMOGLOBIN A1C      canagliflozin (INVOKANA) 100 mg tablet   3. History of pneumonia Z87.01 V12.61    4. Anxiety F41.9 300.00 REFERRAL TO PSYCHIATRY      ALPRAZolam (XANAX) 1 mg tablet   5. Chronic pain of lower extremity, unspecified laterality M79.606 729.5 REFERRAL TO PAIN MANAGEMENT    G89.29 338.29    6. Elevated BP without diagnosis of hypertension R03.0 796.2      Follow-up Disposition:  Return in about 2 weeks (around 03/23/2016) for Schedule your annual physical with fasting labs.       Advised her to call back or return to office if symptoms worsen/change/persist.   Discussed expected course/resolution/complications of diagnosis in detail with patient.    Medication risks/benefits/costs/interactions/alternatives discussed with patient.  She was given an after visit summary which includes diagnoses, current medications, & vitals.  She expressed understanding with the diagnosis and plan.

## 2016-03-09 NOTE — Patient Instructions (Signed)
Recovering From Depression: Care Instructions  Your Care Instructions  Taking good care of yourself is important as you recover from depression. In time, your symptoms will fade as your treatment takes hold. Do not give up. Instead, focus your energy on getting better.  Your mood will improve. It just takes some time. Focus on things that can help you feel better, such as being with friends and family, eating well, and getting enough rest. But take things slowly. Do not do too much too soon. You will begin to feel better gradually.  Follow-up care is a key part of your treatment and safety. Be sure to make and go to all appointments, and call your doctor if you are having problems. It's also a good idea to know your test results and keep a list of the medicines you take.  How can you care for yourself at home?  Be realistic  ?? If you have a large task to do, break it up into smaller steps you can handle, and just do what you can.  ?? You may want to put off important decisions until your depression has lifted. If you have plans that will have a major impact on your life, such as marriage, divorce, or a job change, try to wait a bit. Talk it over with friends and loved ones who can help you look at the overall picture first.  ?? Reaching out to people for help is important. Do not isolate yourself. Let your family and friends help you. Find someone you can trust and confide in, and talk to that person.  ?? Be patient, and be kind to yourself. Remember that depression is not your fault and is not something you can overcome with willpower alone. Treatment is necessary for depression, just like for any other illness. Feeling better takes time, and your mood will improve little by little.  Stay active  ?? Stay busy and get outside. Take a walk, or try some other light exercise.  ?? Talk with your doctor about an exercise program. Exercise can help with mild depression.   ?? Go to a movie or concert. Take part in a church activity or other social gathering. Go to a ball game.  ?? Ask a friend to have dinner with you.  Take care of yourself  ?? Eat a balanced diet with plenty of fresh fruits and vegetables, whole grains, and lean protein. If you have lost your appetite, eat small snacks rather than large meals.  ?? Avoid drinking alcohol or using illegal drugs. Do not take medicines that have not been prescribed for you. They may interfere with medicines you may be taking for depression, or they may make your depression worse.  ?? Take your medicines exactly as they are prescribed. You may start to feel better within 1 to 3 weeks of taking antidepressant medicine. But it can take as many as 6 to 8 weeks to see more improvement. If you have questions or concerns about your medicines, or if you do not notice any improvement by 3 weeks, talk to your doctor.  ?? If you have any side effects from your medicine, tell your doctor. Antidepressants can make you feel tired, dizzy, or nervous. Some people have dry mouth, constipation, headaches, sexual problems, or diarrhea. Many of these side effects are mild and will go away on their own after you have been taking the medicine for a few weeks. Some may last longer. Talk to your doctor if side effects are   bothering you too much. You might be able to try a different medicine.  ?? Get enough sleep. If you have problems sleeping:  ?? Go to bed at the same time every night, and get up at the same time every morning.  ?? Keep your bedroom dark and quiet.  ?? Do not exercise after 5:00 p.m.  ?? Avoid drinks with caffeine after 5:00 p.m.  ?? Avoid sleeping pills unless they are prescribed by the doctor treating your depression. Sleeping pills may make you groggy during the day, and they may interact with other medicine you are taking.  ?? If you have any other illnesses, such as diabetes, heart disease, or  high blood pressure, make sure to continue with your treatment. Tell your doctor about all of the medicines you take, including those with or without a prescription.  ?? Keep the numbers for these national suicide hotlines: 1-800-273-TALK (1-800-273-8255) and 1-800-SUICIDE (1-800-784-2433). If you or someone you know talks about suicide or feeling hopeless, get help right away.  When should you call for help?  Call 911 anytime you think you may need emergency care. For example, call if:  ?? You feel like hurting yourself or someone else.  ?? Someone you know has depression and is about to attempt or is attempting suicide.  Call your doctor now or seek immediate medical care if:  ?? You hear voices.  ?? Someone you know has depression and:  ?? Starts to give away his or her possessions.  ?? Uses illegal drugs or drinks alcohol heavily.  ?? Talks or writes about death, including writing suicide notes or talking about guns, knives, or pills.  ?? Starts to spend a lot of time alone.  ?? Acts very aggressively or suddenly appears calm.  Watch closely for changes in your health, and be sure to contact your doctor if:  ?? You do not get better as expected.  Where can you learn more?  Go to http://www.healthwise.net/GoodHelpConnections.  Enter N529 in the search box to learn more about "Recovering From Depression: Care Instructions."  Current as of: March 11, 2015  Content Version: 11.3  ?? 2006-2017 Healthwise, Inc[employer]. Care instructions adapted under license by [employer] (which disclaims liability or warranty for this information). If you have questions about a medical condition or this instruction, always ask your healthcare professional. Healthwise, Incorporated disclaims any warranty or liability for your use of this information.

## 2016-03-10 ENCOUNTER — Encounter

## 2016-03-10 MED ORDER — ALOGLIPTIN 25 MG TABLET
25 mg | ORAL_TABLET | Freq: Every day | ORAL | 5 refills | Status: DC
Start: 2016-03-10 — End: 2016-04-21

## 2016-03-10 NOTE — Telephone Encounter (Signed)
-----   Message from [place] sent at 03/09/2016  5:52 PM EDT -----  Regarding: NP Skiff/Telephone  Pt requests substitute Rx refill for blood sugar due to insurance.  Pt requests refill Rx "Alogliptin", to [facility], [place], [phone] on file.  Best contact number for pt [phone].

## 2016-03-16 ENCOUNTER — Ambulatory Visit: Admit: 2016-03-16 | Discharge: 2016-03-16 | Attending: Family | Primary: Family

## 2016-03-16 ENCOUNTER — Encounter: Attending: Family | Primary: Family

## 2016-03-16 ENCOUNTER — Inpatient Hospital Stay: Admit: 2016-03-17 | Payer: MEDICAID | Primary: Family

## 2016-03-16 DIAGNOSIS — Z01419 Encounter for gynecological examination (general) (routine) without abnormal findings: Secondary | ICD-10-CM

## 2016-03-16 DIAGNOSIS — Z0001 Encounter for general adult medical examination with abnormal findings: Secondary | ICD-10-CM

## 2016-03-16 LAB — AMB POC URINE, MICROALBUMIN, SEMIQUANT (3 RESULTS)
ALBUMIN, URINE POC: 30 mg/L
CREATININE, URINE POC: 50 mg/dL

## 2016-03-16 MED ORDER — FLUTICASONE 200 MCG-VILANTEROL 25 MCG/DOSE BREATH ACTIVATED INHALER
200-25 mcg/dose | Freq: Every day | RESPIRATORY_TRACT | 2 refills | Status: AC
Start: 2016-03-16 — End: ?

## 2016-03-16 MED ORDER — OMEPRAZOLE 20 MG CAP, DELAYED RELEASE
20 mg | ORAL_CAPSULE | Freq: Every day | ORAL | 0 refills | Status: DC
Start: 2016-03-16 — End: 2016-04-21

## 2016-03-16 MED ORDER — CLONIDINE 0.1 MG TAB
0.1 mg | ORAL_TABLET | Freq: Every evening | ORAL | 0 refills | Status: DC
Start: 2016-03-16 — End: 2016-05-05

## 2016-03-16 MED ORDER — LANCETS
11 refills | Status: AC
Start: 2016-03-16 — End: ?

## 2016-03-16 MED ORDER — BLOOD SUGAR DIAGNOSTIC TEST STRIPS
ORAL_STRIP | 11 refills | Status: AC
Start: 2016-03-16 — End: ?

## 2016-03-16 MED ORDER — LISINOPRIL 20 MG TAB
20 mg | ORAL_TABLET | Freq: Every day | ORAL | 5 refills | Status: DC
Start: 2016-03-16 — End: 2016-05-05

## 2016-03-16 MED ORDER — BLOOD GLUCOSE METER KIT
PACK | 0 refills | Status: AC
Start: 2016-03-16 — End: ?

## 2016-03-16 NOTE — Patient Instructions (Signed)
[facility]: [phone]  -there is a cost for the appointment which covers an xray and exam. Acceptance  into the clinic is determined by the faculty on the needs of the patient and  educational needs of the student. Cost is less than private practice and payment  is expected at the time of treatment. No sliding financial scale is available.    [facility]: [phone]  -this clinic only does teeth extraction. Payment is dependent on financial  screening and a sliding scale of what the patient can afford.    [facility]: [phone]  -[address]    [place]: [phone]  -multiple treatment options. Payment is based on a person???s income and what  they can afford to pay.    [facility]: [phone]  -multiple treatment options. Payment is based on financial screening and a  sliding scale of what the patient can afford.    [facility]: [phone] or [phone]  [place], [place]. Only does extractions, partials, and dentures. Reasonable rates.    Anywhere in the state:  [facility]: [phone]  -Only for patients >62 or those that are on disability  [place]: ([place])  [facility]: [phone]  -[address] Patients must live in the city of [place], [place],  [place], [place], or [place].  [place]: ([place])  [facility]: [phone]  -only for residents of the [facility] area and must be patients of the medical  clinic in the same area.  [place]: ([place])  [place]  -based on financial screening and a sliding scale of what the patient can afford.  [place]: ([place])  [facility]: [place]: ([place])  [place]: [phone]  -this clinic only does extractions (removal) and restorations (fillings)   [place]: ([place])  [facility]: [phone]

## 2016-03-16 NOTE — Telephone Encounter (Signed)
Patient also needs refill for Potassium (Biclofenac) 50 mg tablet

## 2016-03-16 NOTE — Progress Notes (Signed)
Chief Complaint   Patient presents with   ??? Physical   ??? Medication Refill     clonidine, dilofenac, lisinopril     1. Have you been to the ER, urgent care clinic since your last visit?  Hospitalized since your last visit? Yes, 02/2016 [facility]    2. Have you seen or consulted any other health care providers outside of the [facility] since your last visit?  Include any pap smears or colon screening.  No    Pt provided BS reading: Sat- 256, Sun- 226, Mon- 244, Tue- 218, Wed- 274, Thur- 236, Fri- 239        Halen Eastman  is a 55 y.o.  female  who present for TDAP/ Prevnar 13 immunizations/injections.   He/she denies any symptoms , reactions or allergies that would exclude them from being immunized today.  Risks and adverse reactions were discussed and the VIS was given if applicable to them. All questions were addressed.  He/She was observed for 5 min post injection. There were no reactions observed.    Glyn Ade, LPN

## 2016-03-16 NOTE — Progress Notes (Signed)
Subjective:      Kathy Bush is a 55 y.o. female who presents today for CPE with PAP and fasting labs.    Health Maintenance  Immunizations:    Influenza: she declines.  Tetanus: not UTD- will do today.  Shingles: reviewed with the patient, will defer to age 50.  Pneumonia: n/a.     Cancer screening:    Cervical: reviewed guidelines, not UTD - will do today.  Breast: reviewed guidelines, reviewed SBE with her.  Colon: referral placed to GI.      Patient Care Team:  Provider Unknown as PCP - General      The following sections were reviewed & updated as appropriate: PMH, PSH, FH, and SH.      Patient Active Problem List   Diagnosis Code   ??? Anxiety F41.9   ??? History of pneumonia Z87.01   ??? Chronic obstructive pulmonary disease with acute exacerbation (HCC) J44.1   ??? 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 Uncontrolled type 2 diabetes mellitus without complication, without long-term current use of insulin (HCC) E11.65   ??? Elevated BP without diagnosis of hypertension R03.0   ??? COPD with acute exacerbation ([place]) J44.1   ??? Essential hypertension I10   ??? Type 2 diabetes mellitus (HCC) E11.9   ??? Tobacco abuse Z72.0   ??? CAP (community acquired pneumonia) J18.9   ??? Pneumonia J18.9   ??? Mood disorder ([place]) F39   ??? Benzodiazepine dependence ([place]) F13.20          Prior to Admission medications    Medication Sig Start Date End Date Taking? Authorizing Provider   alogliptin (NESINA) 25 mg tablet Take 1 Tab by mouth daily. 03/10/16  Yes Aundra Millet Oris Staffieri, NP   fluticasone-vilanterol (BREO ELLIPTA) 200-25 mcg/dose inhaler Take 1 Puff by inhalation daily.   Yes Historical Provider   hydrOXYzine HCl (ATARAX) 50 mg tablet Take 50 mg by mouth three (3) times daily as needed for Itching.   Yes Historical Provider   canagliflozin (INVOKANA) 100 mg tablet Take 1 Tab by mouth Daily (before breakfast). 03/09/16  Yes Hyman Bower, NP   ALPRAZolam Duanne Moron) 1 mg tablet Take 1 Tab by mouth daily as needed for  Anxiety. 03/09/16  Yes Aundra Millet Quantavious Eggert, NP   amLODIPine (NORVASC) 5 mg tablet Take 1 Tab by mouth daily. 03/03/16  Yes Jac Canavan, MD   empagliflozin (JARDIANCE) 25 mg tablet Take 1 Tab by mouth daily. 03/03/16  Yes Jac Canavan, MD   hydrocortisone (CORTAID) 1 % topical cream use thin layer to affected areas 02/28/16  Yes Ree Kida, MD   cloNIDine HCl (CATAPRES) 0.1 mg tablet Take 0.1 mg by mouth nightly.   Yes Historical Provider   ibuprofen (MOTRIN) 800 mg tablet Take 800 mg by mouth every eight (8) hours as needed for Pain.   Yes Historical Provider   fluticasone-vilanterol (BREO ELLIPTA) 200-25 mcg/dose inhaler Take 1 Puff by inhalation daily. 02/16/16  Yes Carlis Stable, PA-C   lisinopril (PRINIVIL, ZESTRIL) 20 mg tablet Take 20 mg by mouth daily.   Yes Phys Other, MD   glipiZIDE (GLUCOTROL) 5 mg tablet Take 5 mg by mouth daily.   Yes Phys Other, MD   hydrOXYzine HCl (ATARAX) 50 mg tablet Take 1 Tab by mouth every six (6) hours as needed for Itching. 01/09/16  Yes Paulo Fruit, PA   albuterol (PROVENTIL HFA, VENTOLIN HFA, PROAIR HFA) 90 mcg/actuation inhaler Take 1-2 Puffs by inhalation every four (4)  hours as needed for Wheezing. 03/03/16   Jac Canavan, MD      No Known Allergies     Family History   Problem Relation Age of Onset   ??? Cancer Mother      intestinal   ??? Heart Disease Father    ??? Hypertension Father      Social History   Substance Use Topics   ??? Smoking status: Never Smoker   ??? Smokeless tobacco: Never Used   ??? Alcohol use No        Review of Systems    A comprehensive review of systems was negative.     Objective:     Visit Vitals   ??? BP 123/82   ??? Pulse 75   ??? Temp 97.3 ??F (36.3 ??C) (Oral)   ??? Resp 18   ??? Ht _0  (1.575 m)   ??? Wt 192 lb (87.1 kg)   ??? BMI 35.12 kg/m2     General appearance: alert, cooperative, no distress, appears stated age  Head: Normocephalic, without obvious abnormality, atraumatic  Eyes: positive findings: conjunctivae: arcus senilis   Ears: normal external ear canals AU. Right with some serous fluid behind TM, left TM normal  Nose: Nares normal. Septum midline. Mucosa normal. No drainage or sinus tenderness.  Throat: Lips, mucosa, and tongue normal. Poor dentition  Neck: supple, symmetrical, trachea midline, no adenopathy, thyroid: not enlarged, symmetric, no tenderness/mass/nodules, no carotid bruit and no JVD  Back: symmetric, no curvature. ROM normal. No CVA tenderness.  Lungs: clear to auscultation bilaterally  Breasts: normal appearance, no masses or tenderness  Heart: regular rate and rhythm, S1, S2 normal, no murmur, click, rub or gallop  Abdomen: soft, non-tender. Bowel sounds normal. No masses,  no organomegaly  Pelvic: External genitalia normal, Vagina normal without discharge, cervix normal in appearance, no CMT, uterus normal size, shape, and consistency, no adnexal masses or tenderness  Extremities: extremities normal, atraumatic, no cyanosis or edema  Pulses: 2+ and symmetric  Skin: Skin color, texture, turgor normal. No rashes or lesions  Lymph nodes: Cervical, supraclavicular, and axillary nodes normal.  Neurologic: Alert and oriented X 3, normal strength and tone. Normal symmetric reflexes. Normal coordination and gait  Psych: appropriate mood, speech, affect  Nursing note and vitals reviewed  Assessment/Plan:       ICD-10-CM ICD-9-CM    1. Encounter for general adult medical examination with abnormal findings Z00.01 V70.0 CBC WITH AUTOMATED DIFF      LIPID PANEL      METABOLIC PANEL, COMPREHENSIVE      VITAMIN D, 25 HYDROXY   2. Encounter for routine gynecological examination with Papanicolaou smear of cervix Z01.419 V72.31 PAP IG, RFX HPV ASCU, 16&18,45([id number])     V76.2    3. Screening for malignant neoplasm of cervix Z12.4 V76.2 PAP IG, RFX HPV ASCU, 16&18,45([id number])   4. Screening for malignant neoplasm of breast Z12.39 V76.10 MAM MAMMO BI SCREENING INCL CAD    5. Need for hepatitis C screening test Z11.59 V73.89 HEPATITIS C AB   6. Type 2 diabetes mellitus without complication, without long-term current use of insulin (HCC) E11.9 250.00 AMB POC URINE, MICROALBUMIN, SEMIQUANT (3 RESULTS)      REFERRAL TO DIABETIC EDUCATION      glucose blood VI test strips (ASCENSIA AUTODISC VI, ONE TOUCH ULTRA TEST VI) strip      Blood-Glucose Meter monitoring kit      Lancets misc   7. Colon cancer screening Z12.11 V76.51 OCCULT BLOOD,  IMMUNOASSAY (FIT)      REFERRAL TO GASTROENTEROLOGY   8. Encounter for immunization Z23 V03.89 TETANUS, DIPHTHERIA TOXOIDS AND ACELLULAR PERTUSSIS VACCINE (TDAP), IN INDIVIDS. >=7, IM      PNEUMOCOCCAL CONJ VACCINE 13 VALENT IM   9. Essential hypertension I10 401.9 REFERRAL TO CARDIOLOGY      AMB POC EKG ROUTINE W/ 12 LEADS, INTER & REP      cloNIDine HCl (CATAPRES) 0.1 mg tablet      lisinopril (PRINIVIL, ZESTRIL) 20 mg tablet   10. Gastroesophageal reflux disease without esophagitis K21.9 530.81 omeprazole (PRILOSEC) 20 mg capsule          Advised her to call back or return to office if symptoms worsen/change/persist.  Discussed expected course/resolution/complications of diagnosis in detail with patient.    Medication risks/benefits/costs/interactions/alternatives discussed with patient.  She was given an after visit summary which includes diagnoses, current medications, & vitals.  She expressed understanding with the diagnosis and plan.

## 2016-03-17 ENCOUNTER — Encounter

## 2016-03-17 LAB — CBC WITH AUTOMATED DIFF
ABS. BASOPHILS: 0 10*3/uL (ref 0.0–0.2)
ABS. EOSINOPHILS: 0 10*3/uL (ref 0.0–0.4)
ABS. IMM. GRANS.: 0 10*3/uL (ref 0.0–0.1)
ABS. MONOCYTES: 0.3 10*3/uL (ref 0.1–0.9)
ABS. NEUTROPHILS: 2.8 10*3/uL (ref 1.4–7.0)
Abs Lymphocytes: 1.5 10*3/uL (ref 0.7–3.1)
BASOPHILS: 0 %
EOSINOPHILS: 1 %
HCT: 44 % (ref 34.0–46.6)
HGB: 14.1 g/dL (ref 11.1–15.9)
IMMATURE GRANULOCYTES: 0 %
Lymphocytes: 33 %
MCH: 29.1 pg (ref 26.6–33.0)
MCHC: 32 g/dL (ref 31.5–35.7)
MCV: 91 fL (ref 79–97)
MONOCYTES: 6 %
NEUTROPHILS: 60 %
PLATELET: 252 10*3/uL (ref 150–379)
RBC: 4.85 x10E6/uL (ref 3.77–5.28)
RDW: 14.9 % (ref 12.3–15.4)
WBC: 4.7 10*3/uL (ref 3.4–10.8)

## 2016-03-17 LAB — METABOLIC PANEL, COMPREHENSIVE
A-G Ratio: 1.6 (ref 1.2–2.2)
ALT (SGPT): 8 IU/L (ref 0–32)
AST (SGOT): 11 IU/L (ref 0–40)
Albumin: 3.9 g/dL (ref 3.5–5.5)
Alk. phosphatase: 81 IU/L (ref 39–117)
BUN/Creatinine ratio: 15 (ref 9–23)
BUN: 12 mg/dL (ref 6–24)
Bilirubin, total: 0.3 mg/dL (ref 0.0–1.2)
CO2: 23 mmol/L (ref 18–29)
Calcium: 9.1 mg/dL (ref 8.7–10.2)
Chloride: 98 mmol/L (ref 96–106)
Creatinine: 0.8 mg/dL (ref 0.57–1.00)
GFR est AA: 96 mL/min/{1.73_m2} (ref >59)
GFR est non-AA: 83 mL/min/{1.73_m2} (ref >59)
GLOBULIN, TOTAL: 2.4 g/dL (ref 1.5–4.5)
Glucose: 189 mg/dL — ABNORMAL HIGH (ref 65–99)
Potassium: 4.1 mmol/L (ref 3.5–5.2)
Protein, total: 6.3 g/dL (ref 6.0–8.5)
Sodium: 138 mmol/L (ref 134–144)

## 2016-03-17 LAB — HEPATITIS C AB: Hep C Virus Ab: <0.1 s/co ratio (ref 0.0–0.9)

## 2016-03-17 LAB — LIPID PANEL
Cholesterol, total: 226 mg/dL — ABNORMAL HIGH (ref 100–199)
HDL Cholesterol: 59 mg/dL (ref >39)
LDL, calculated: 142 mg/dL — ABNORMAL HIGH (ref 0–99)
Triglyceride: 126 mg/dL (ref 0–149)
VLDL, calculated: 25 mg/dL (ref 5–40)

## 2016-03-17 LAB — VITAMIN D, 25 HYDROXY: VITAMIN D, 25-HYDROXY: 15.3 ng/mL — ABNORMAL LOW (ref 30.0–100.0)

## 2016-03-17 LAB — HEPATITIS C ANTIBODY: HCV Ab: <0.1 s/co ratio (ref 0.0–0.9)

## 2016-03-17 MED ORDER — ERGOCALCIFEROL (VITAMIN D2) 50,000 UNIT CAP
1250 mcg (50,000 unit) | ORAL_CAPSULE | ORAL | 3 refills | Status: DC
Start: 2016-03-17 — End: 2016-07-20

## 2016-03-17 MED ORDER — ROSUVASTATIN 20 MG TAB
20 mg | ORAL_TABLET | Freq: Every evening | ORAL | 2 refills | Status: DC
Start: 2016-03-17 — End: 2016-07-17

## 2016-03-17 NOTE — Progress Notes (Signed)
Cholesterol elevated and vitamin D deficiency noted. Crestor and ergocalciferol initiated. Follow up needed in 4 weeks

## 2016-03-17 NOTE — Progress Notes (Signed)
Called and spoke with pt per NP [facility] and explained that her cholesterol was elevated and her vitamin D was low. A prescription has been sent to her pharmacy for both. Instructed pt if she was to start having cramping once she starts taking cholesterol medication she should stop and make a follow up appt. Pt voiced an understanding. Cindy Hazy, LPN

## 2016-03-25 ENCOUNTER — Inpatient Hospital Stay: Admit: 2016-03-25 | Payer: PRIVATE HEALTH INSURANCE | Attending: Family | Primary: Family

## 2016-03-25 DIAGNOSIS — Z1231 Encounter for screening mammogram for malignant neoplasm of breast: Secondary | ICD-10-CM

## 2016-03-30 ENCOUNTER — Encounter

## 2016-03-30 NOTE — Telephone Encounter (Signed)
Patient have an appointment with Dr. Tito Dine (pain management) on Thursday, April 01, 2016 for left ankle pain. Please place referral. Thanks!    [facility]   [place] [address]  Phone: [phone]  Fax: [phone] or [phone]

## 2016-03-31 ENCOUNTER — Encounter: Primary: Family

## 2016-04-06 ENCOUNTER — Encounter

## 2016-04-06 MED ORDER — ALPRAZOLAM 1 MG TAB
1 mg | ORAL_TABLET | Freq: Every day | ORAL | 0 refills | Status: DC | PRN
Start: 2016-04-06 — End: 2016-05-05

## 2016-04-06 NOTE — Telephone Encounter (Signed)
Please let her know that this is her last refill of this medication. She was given psychiatry referral, she needs to be getting anxiety medication from psychiatrist

## 2016-04-06 NOTE — Telephone Encounter (Addendum)
Patient has an appointment on 8/28 with the podiatrist.  Dr. 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 Patient will be seeing the podiatrist for treatment of the area where the tumor was.   Patient insurance requires a referral.

## 2016-04-09 LAB — OCCULT BLOOD IMMUNOASSAY,DIAGNOSTIC

## 2016-04-16 ENCOUNTER — Encounter

## 2016-04-16 MED ORDER — FLUCONAZOLE 150 MG TAB
150 mg | ORAL_TABLET | Freq: Every day | ORAL | 0 refills | Status: AC
Start: 2016-04-16 — End: 2016-04-17

## 2016-04-16 NOTE — Telephone Encounter (Signed)
Patient states that she gets yeast infections with her diabetes. Patient would like to know if nurse practitioner can send something in to her pharmacy for a yeast infection.

## 2016-04-16 NOTE — Telephone Encounter (Signed)
Per NP Skiff I called to inform pt that she sent the diflucan for this one time only because its a holiday weekend. If she has this concern again, she will need to come in for a vaginal swab. She is also not to use hydroxyzine taking the diflucan. Pt voiced understanding. Glyn Ade, LPN

## 2016-04-21 ENCOUNTER — Encounter

## 2016-04-21 ENCOUNTER — Ambulatory Visit: Admit: 2016-04-21 | Discharge: 2016-04-21 | Attending: Cardiovascular Disease | Primary: Family

## 2016-04-21 DIAGNOSIS — I10 Essential (primary) hypertension: Secondary | ICD-10-CM

## 2016-04-21 MED ORDER — AMLODIPINE 10 MG TAB
10 mg | ORAL_TABLET | Freq: Every day | ORAL | 12 refills | Status: DC
Start: 2016-04-21 — End: 2016-05-05

## 2016-04-21 MED ORDER — SITAGLIPTIN-METFORMIN 50 MG-1,000 MG TAB
50-1000 mg | ORAL_TABLET | Freq: Two times a day (BID) | ORAL | 2 refills | Status: DC
Start: 2016-04-21 — End: 2016-07-23

## 2016-04-21 NOTE — Patient Instructions (Signed)
Please double your AMLODIPINE until you run out. The refill will be for the 10 mg size and then please go back to one daily.     I have ordered an echocardiogram for you and an ultrasound of your carotids as well ( blood vessels in your neck). I want to see you again right after those tests are completed.     We need to lower your blood pressure a step at a time and not bring it immediately to normal.

## 2016-04-21 NOTE — Telephone Encounter (Signed)
Per NP Skiff, called to inform pt that a medication called Janumet was sent to her pharmacy. She is to take it 2 times a day. Advised pt that if the medication is not covered, she will want her to start a medication called Victoza. Informed pt that this medication is not insulin, but it is a subcu injection that will help control diabetes and help with weight loss. Pt voiced understanding. Glyn Ade, LPN

## 2016-04-21 NOTE — Progress Notes (Signed)
Longstanding hypertensive. Diabetic. COPD, ongoing smoker. 1/2 block dyspnea without chest pain. Many minutes to recover. She gets occasional palpitations with rapid heart rate. Occasional episodes of dizziness for 2-3 minutes with whirling, no syncope, rare ringing in ears. She was given nitro in the past but she does not note any specific benefit.     Family history is positive for "heart disease" She gives a  history of 7+ episodes of new neurologic symptoms, mostly based in the left ulnar nerve distribution, now with persistent tingling and numbness of the left hand, particularly  In the last event however she also  had speech and facial involvement and was evaluated and admitted in [facility]. Ultimately she reports there was no Dx of stroke.     EKG shows LVH and probable strain pattern with QS complexes V1 and V2.  Stress in 2008 with imaging was negative as far as she knows ([place]).    Recent labs:    Results for DEEDRA, PRO (MRN [id number]) as of 04/21/2016 15:56   Ref. Range 03/09/2016 16:17 03/16/2016 09:40   Sodium Latest Ref Range: 134 - 144 mmol/L  138   Potassium Latest Ref Range: 3.5 - 5.2 mmol/L  4.1   Chloride Latest Ref Range: 96 - 106 mmol/L  98   CO2 Latest Ref Range: 18 - 29 mmol/L  23   Glucose Latest Ref Range: 65 - 99 mg/dL  189 (H)   BUN Latest Ref Range: 6 - 24 mg/dL  12   Creatinine Latest Ref Range: 0.57 - 1.00 mg/dL  0.80   BUN/Creatinine ratio Latest Ref Range: 9 - 23   15   Calcium Latest Ref Range: 8.7 - 10.2 mg/dL  9.1   GFR est non-AA Latest Ref Range: >59 mL/min/1.73  83   GFR est AA Latest Ref Range: >59 mL/min/1.73  96   Bilirubin, total Latest Ref Range: 0.0 - 1.2 mg/dL  0.3   Protein, total Latest Ref Range: 6.0 - 8.5 g/dL  6.3   Albumin Latest Ref Range: 3.5 - 5.5 g/dL  3.9   A-G Ratio Latest Ref Range: 1.2 - 2.2   1.6   ALT (SGPT) Latest Ref Range: 0 - 32 IU/L  8   AST Latest Ref Range: 0 - 40 IU/L  11   Alk. phosphatase Latest Ref Range: 39 - 117 IU/L  81    Triglyceride Latest Ref Range: 0 - 149 mg/dL  126   Cholesterol, total Latest Ref Range: 100 - 199 mg/dL  226 (H)   HDL Cholesterol Latest Ref Range: >39 mg/dL  59   LDL, calculated Latest Ref Range: 0 - 99 mg/dL  142 (H)   VLDL, calculated Latest Ref Range: 5 - 40 mg/dL  25   Hemoglobin A1c (POC) Latest Units: % 10.2        Chest X ray shows borderline normal cardiac size with surprising lack of prominence of the aorta for a longstanding hypertensive         .    On exam, BP is 190/103. She is a short statured obese female in no distress at the time of exam.      Room air O2 saturation is 96% but she states she desaturated during an ED visit here in [facility] last month.  She weighs 197# and she is 5'2" tall. Resting pulse is 76/min.    Lungs are clear, carotids are silent, there is no renal bruit, no abdominal pulsation. There is no edema and no  sign of DVT. Cardiac auscultation shows regular rhythm with loud S1, normal S2, A2>P2. There is a soft S4, no murmur    Impression: Severe and poorly controlled hypertension    Recurrent transient focal neurologic events    Rule out CVD    Rule out cardiac source for embolic events    Plan:  Echocardiogram, saline then definity contrast exam of atria and atrial septum    Carotid and vertebral artery duplex ultrasound    Double amlodipine to 10 mg daily    Home blood pressure monitoring before any other  change in therapy    Repeat office visikt as soon as echo is done to decide therapy

## 2016-04-21 NOTE — Telephone Encounter (Signed)
Patient states that the alogliptin is not working for her with treating her diabetes. Patient mentioned that her numbers climb up to 400s/500s.

## 2016-04-22 MED ORDER — OMEPRAZOLE 20 MG CAP, DELAYED RELEASE
20 mg | ORAL_CAPSULE | ORAL | 0 refills | Status: AC
Start: 2016-04-22 — End: ?

## 2016-04-22 NOTE — Telephone Encounter (Signed)
She was seen by cardiology yesterday and should be getting all medications for hypertension through them. Can you call and ask her about this? I am not even sure if this medication was continued or not by Dr. Claiborne Billings (cardiology)

## 2016-04-29 ENCOUNTER — Inpatient Hospital Stay: Admit: 2016-04-29 | Payer: PRIVATE HEALTH INSURANCE | Attending: Cardiovascular Disease | Primary: Family

## 2016-04-29 DIAGNOSIS — I10 Essential (primary) hypertension: Secondary | ICD-10-CM

## 2016-04-29 DIAGNOSIS — I119 Hypertensive heart disease without heart failure: Secondary | ICD-10-CM

## 2016-04-29 NOTE — Procedures (Signed)
[facility]  *** FINAL REPORT ***    Name: KINSLEIGH, DEERY  MRN: [id number]    Outpatient  DOB: September 01, 1960  HIS Order #: [id number]  [place] Visit #: [id number]  Date: 29 Apr 2016    TYPE OF TEST: Cerebrovascular Duplex    REASON FOR TEST  Transient ischemic attacks    Right Carotid:-             Proximal               Mid                 Distal  cm/s  Systolic  Diastolic  Systolic  Diastolic  Systolic  Diastolic  CCA:     [id number]      15.0                            27.0       9.0  Bulb:  ECA:     68.0       5.0  ICA:     27.0       8.0       46.0      17.0      108.0      36.0  ICA/CCA:  1.0       0.9    ICA Stenosis: <50%    Right Vertebral:-  Finding: Antegrade  Sys:       50.0  Dia:       16.0    Right Subclavian: Normal    Left Carotid:-            Proximal                Mid                 Distal  cm/s  Systolic  Diastolic  Systolic  Diastolic  Systolic  Diastolic  CCA:     [id number]      14.0                            50.0      17.0  Bulb:  ECA:     83.0      19.0  ICA:     48.0      17.0       61.0      23.0       67.0      23.0  ICA/CCA:  1.0       1.0    ICA Stenosis: <50%    Left Vertebral:-  Finding: Antegrade  Sys:       62.0  Dia:       19.0    Left Subclavian: Normal    INTERPRETATION/FINDINGS  1. Bilateral <50% stenosis of the internal carotid arteries.  2. No significant stenosis in the external carotid arteries  bilaterally.  3. Antegrade flow in both vertebral arteries.  4. Normal flow in both subclavian arteries.  Plaque Morphology:  1. Heterogeneous plaque in the bulb and right ICA.  2. Heterogeneous plaque in the bulb and left ICA.    ADDITIONAL COMMENTS    I have personally reviewed the data relevant to the interpretation of  this  study.    TECHNOLOGIST: Alm Bustard, RVS  Signed: 04/29/2016 03:06 PM    PHYSICIAN: Arvilla Market. Vonzella Nipple, MD  Signed: 05/03/2016  04:55 PM

## 2016-05-02 LAB — ECHOCARDIOGRAM COMPLETE 2D W DOPPLER W COLOR: Left Ventricular Ejection Fraction: 30

## 2016-05-03 NOTE — Procedures (Signed)
[facility]  *** FINAL REPORT ***    Name: Kathy Bush, Kathy Bush  MRN: [id number]    Outpatient  DOB: Oct 13, 1960  HIS Order #: [id number]  [place] Visit #: [id number]  Date: 29 Apr 2016    TYPE OF TEST: Cerebrovascular Duplex    REASON FOR TEST  Transient ischemic attacks    Right Carotid:-             Proximal               Mid                 Distal  cm/s  Systolic  Diastolic  Systolic  Diastolic  Systolic  Diastolic  CCA:     [id number]      15.0                            27.0       9.0  Bulb:  ECA:     68.0       5.0  ICA:     27.0       8.0       46.0      17.0      108.0      36.0  ICA/CCA:  1.0       0.9    ICA Stenosis: <50%    Right Vertebral:-  Finding: Antegrade  Sys:       50.0  Dia:       16.0    Right Subclavian: Normal    Left Carotid:-            Proximal                Mid                 Distal  cm/s  Systolic  Diastolic  Systolic  Diastolic  Systolic  Diastolic  CCA:     [id number]      14.0                            50.0      17.0  Bulb:  ECA:     83.0      19.0  ICA:     48.0      17.0       61.0      23.0       67.0      23.0  ICA/CCA:  1.0       1.0    ICA Stenosis: <50%    Left Vertebral:-  Finding: Antegrade  Sys:       62.0  Dia:       19.0    Left Subclavian: Normal    INTERPRETATION/FINDINGS  1. Bilateral <50% stenosis of the internal carotid arteries.  2. No significant stenosis in the external carotid arteries  bilaterally.  3. Antegrade flow in both vertebral arteries.  4. Normal flow in both subclavian arteries.  Plaque Morphology:  1. Heterogeneous plaque in the bulb and right ICA.  2. Heterogeneous plaque in the bulb and left ICA.    ADDITIONAL COMMENTS    I have personally reviewed the data relevant to the interpretation of  this  study.    TECHNOLOGIST: Alm Bustard, RVS  Signed: 04/29/2016 03:06 PM    PHYSICIAN: Arvilla Market. Vonzella Nipple, MD  Signed: 05/03/2016  04:55 PM

## 2016-05-03 NOTE — Telephone Encounter (Signed)
Outgoing call to patient per. Cyril Mourning PA-C letting her know she has positive findings on both tests, and that she should keep her appointment in 2 days. Patient verbalized understanding.        Marland Kitchen

## 2016-05-05 ENCOUNTER — Ambulatory Visit: Admit: 2016-05-05 | Discharge: 2016-05-05 | Attending: Cardiovascular Disease | Primary: Family

## 2016-05-05 ENCOUNTER — Encounter

## 2016-05-05 DIAGNOSIS — I5042 Chronic combined systolic (congestive) and diastolic (congestive) heart failure: Secondary | ICD-10-CM

## 2016-05-05 MED ORDER — ALPRAZOLAM 1 MG TAB
1 mg | ORAL_TABLET | Freq: Every day | ORAL | 0 refills | Status: AC | PRN
Start: 2016-05-05 — End: ?

## 2016-05-05 MED ORDER — SACUBITRIL 24 MG-VALSARTAN 26 MG TABLET
24-26 mg | ORAL_TABLET | Freq: Two times a day (BID) | ORAL | 11 refills | Status: AC
Start: 2016-05-05 — End: ?

## 2016-05-05 MED ORDER — SPIRONOLACTONE 25 MG TAB
25 mg | ORAL_TABLET | Freq: Every day | ORAL | 11 refills | Status: AC
Start: 2016-05-05 — End: ?

## 2016-05-05 NOTE — Progress Notes (Signed)
[facility]   [address] [place]                                          NEW PATIENT HPI/FOLLOW-UP      NAME:  Kathy Bush   DOB:   03-04-1961 ,  MRN:   [id number]   PCP:  Hyman Bower, NP    ??????    Subjective:     The patient is a 55 y.o. year old female h/o COPD, tobacco use, HTN, T2DM, HTN, combined systolic diastolic CHF NYHA Class III with 30% EF on recent echo and congestive cardiomyopathy who returns for a routine follow-up.     Since the last visit, patient reports chronic b/l ankle swelling that is slowing worsening and not totally resolved by elevation. DOE with mild-moderate activity. Pt is a security guard, and constantly walking. She does get SOB at rest, but notes she is a smoker and has COPD. However, she states her pulmonologist said her lungs were not the cause of the SOB which is why she was sent to [place].    Denies chest pain, medication intolerance, palpitations, PND/orthopnea, wheezing, sputum, syncope, dizziness or light headedness.     Doing satisfactorily.      Review of Systems  General: Pt denies excessive weight gain or loss. Pt is able to conduct ADL's.  Respiratory: +shortness of breath, DOE, Denies wheezing or stridor.  Cardiovascular: +edema Denies precordial pain, palpitations, or PND  Gastrointestinal: Denies poor appetite, indigestion, abdominal pain or blood in stool  Peripheral vascular: Denies claudication, leg cramps  Neuropsychiatric: Denies paresthesias,tingling,numbness,anxiety,depression,fatigue  Musculoskeletal: Denies pain,tenderness, soreness,swelling      Past Medical History:   Diagnosis Date   ??? Asthma    ??? Chronic obstructive pulmonary disease ([place])    ??? COPD (chronic obstructive pulmonary disease) ([place])    ??? Diabetes ([place])    ??? Emphysema/COPD ([place])    ??? Hypertension      Patient Active Problem List    Diagnosis Date Noted   ??? Hyperlipemia 03/17/2016   ??? Anxiety 03/09/2016   ??? History of pneumonia 03/09/2016    ??? Chronic obstructive pulmonary disease with acute exacerbation ([place]) 03/09/2016   ??? Manic depression ([place]) 03/09/2016   ??? Uncontrolled type 2 diabetes mellitus without complication, without long-term current use of insulin ([place]) 03/09/2016   ??? Elevated BP without diagnosis of hypertension 03/09/2016   ??? Mood disorder ([place]) 03/03/2016   ??? Benzodiazepine dependence ([place]) 03/03/2016   ??? Pneumonia 03/02/2016   ??? CAP (community acquired pneumonia) 03/01/2016   ??? Essential hypertension 02/28/2016   ??? Type 2 diabetes mellitus ([place]) 02/28/2016   ??? Tobacco abuse 02/28/2016   ??? COPD with acute exacerbation ([place]) 02/27/2016      Past Surgical History:   Procedure Laterality Date   ??? HX ORTHOPAEDIC      back   ??? HX ORTHOPAEDIC       No Known Allergies   Family History   Problem Relation Age of Onset   ??? Cancer Mother      intestinal   ??? Heart Disease Father    ??? Hypertension Father    ??? Breast Cancer Maternal Aunt 43      Social History     Social History   ??? Marital status: SINGLE     Spouse name:  N/A   ??? Number of children: N/A   ??? Years of education: N/A     Occupational History   ??? Not on file.     Social History Main Topics   ??? Smoking status: Current Every Day Smoker   ??? Smokeless tobacco: Never Used   ??? Alcohol use No   ??? Drug use: No   ??? Sexual activity: Not on file     Other Topics Concern   ??? Not on file     Social History Narrative    ** Merged History Encounter **           Current Outpatient Prescriptions   Medication Sig   ??? ALPRAZolam (XANAX) 1 mg tablet Take 1 Tab by mouth daily as needed for Anxiety.   ??? sacubitril-valsartan (ENTRESTO) 24 mg/26 mg tablet Take 1 Tab by mouth two (2) times a day. Indications: Chronic Heart Failure   ??? spironolactone (ALDACTONE) 25 mg tablet Take 1 Tab by mouth daily.   ??? omeprazole (PRILOSEC) 20 mg capsule take 1 capsule by mouth once daily   ??? buPROPion XL (WELLBUTRIN XL) 300 mg XL tablet Take 300 mg by mouth every morning.    ??? diclofenac EC (VOLTAREN) 75 mg EC tablet Take  by mouth daily.   ??? SITagliptin-metFORMIN (JANUMET) 50-1,000 mg per tablet Take 1 Tab by mouth two (2) times daily (with meals).   ??? ergocalciferol (ERGOCALCIFEROL) 50,000 unit capsule Take 1 Cap by mouth every seven (7) days.   ??? rosuvastatin (CRESTOR) 20 mg tablet Take 1 Tab by mouth nightly.   ??? glucose blood VI test strips (ASCENSIA AUTODISC VI, ONE TOUCH ULTRA TEST VI) strip Use two times daily to check your blood sugas   ??? Blood-Glucose Meter monitoring kit Use 2 times daily to check blood sugar   ??? Lancets misc Use 2 times daily for blood sugar checks   ??? fluticasone-vilanterol (BREO ELLIPTA) 200-25 mcg/dose inhaler Take 1 Puff by inhalation daily.   ??? hydrOXYzine HCl (ATARAX) 50 mg tablet Take 50 mg by mouth as needed for Itching.   ??? albuterol (PROVENTIL HFA, VENTOLIN HFA, PROAIR HFA) 90 mcg/actuation inhaler Take 1-2 Puffs by inhalation every four (4) hours as needed for Wheezing.   ??? hydrocortisone (CORTAID) 1 % topical cream use thin layer to affected areas   ??? ibuprofen (MOTRIN) 800 mg tablet Take 800 mg by mouth every eight (8) hours as needed for Pain.   ??? glipiZIDE (GLUCOTROL) 5 mg tablet Take 5 mg by mouth daily.     No current facility-administered medications for this visit.         I have reviewed the MAs notes, vitals, problem list, allergy list, medical history, family medical, social history and medications.      Objective:     Physical Exam:     Vitals:    05/05/16 1542   BP: (!) 176/106   Pulse: 67   SpO2: 97%   Weight: 200 lb (90.7 kg)   Height: '5\' 2"'$  (1.575 m)    Body mass index is 36.58 kg/(m^2).    General: WDWN adult female, in no acute distress. Pleasant affect.  HEENT: No carotid bruits, no JVD, trach is midline.   Heart: ??Normal S1/S2 negative S3 or S4. Regular, no murmur, gallop or rub.??  Respiratory: Clear bilaterally, no wheezing or rales  Abdomen:?? ??Soft, non-tender, bowel sounds are active.??   Extremities:  Diffuse b/l ankle edema, normal cap refill, no cyanosis.  Neuro: A&Ox3,  speech clear, gait stable.   Skin: Skin color is normal. No rashes or lesions. No diaphoresis.  Vascular: 2+ pulses symmetric in all extremities      Data Review:       Cardiographics:    EKG: None today    Transthoracic Echocardiogram    Patient: Kathy Bush  MRN: [id number]  ACCT #: [id number]  DOB: 10-May-1961  Age: 60 years  Gender: Female  Height: 62 in  Weight: 196.7 lb  BSA: 1.9 m squared  BP: 190 / 103 mmHg  Study date: 29-Apr-2016  Status: Routine  Location: Echo lab  DMS [facility] #: [id number]    Allergies: NO KNOWN ALLERGIES    Ordering Physician: ??Rayford Halsted, MD  Reading Physician: ??Rayford Halsted, MD  Reading Group: ??Rayford Halsted, MD  Technologist: ??Zeb Comfort, RCS    IMPRESSIONS:  Findings are consistent withmoderately severe congestive cardiomyopathy.  PA pressure does not seem elevated. There is minimal aortic sclerosis  without significant stenosis. There is no other apparent source for a  systolic murmur.    SUMMARY:  Left ventricle: Systolic function was moderately to markedly reduced by  visual assessment. Ejection fraction was estimated to be 30 %. There was  moderate diffuse hypokinesis. Wall thickness was moderately increased.  There was moderate concentric hypertrophy. There was no asymmetric  hypertrophy. Mass was definitely increased. Doppler parameters were  consistent with a reversible restrictive pattern, indicative of decreased  left ventricular diastolic compliance and/or increased left atrial  pressure (grade 3 diastolic dysfunction).    Ventricular septum: Thickness was moderately increased.    Right ventricle: Wall thickness was mildly increased.    Left atrial appendage: The appendage was moderately to markedly dilated.  The function was moderately reduced (moderately reduced emptying velocity).    Atrial septum: The atrial septum is mildly patulous with no evidence of  patency/.     Mitral valve: There was moderate annular fibrosis.    Aortic valve: The aorticv valve is a minimally sclerotic structure with a  peak gradient of 8 mm. The valve was trileaflet. Leaflets exhibited  moderately increased thickness, mild calcification, mildly reduced cuspal  separation, reduced mobility, and sclerosis. No obvious mass, vegetation  or thrombus noted.    Cardiology Labs:    No results found for this or any previous visit.    Lab Results   Component Value Date/Time    Cholesterol, total 226 03/16/2016 09:40 AM    HDL Cholesterol 59 03/16/2016 09:40 AM    LDL, calculated 142 03/16/2016 09:40 AM    Triglyceride 126 03/16/2016 09:40 AM       Lab Results   Component Value Date/Time    Sodium 138 03/16/2016 09:40 AM    Potassium 4.1 03/16/2016 09:40 AM    Chloride 98 03/16/2016 09:40 AM    CO2 23 03/16/2016 09:40 AM    Anion gap 11 03/02/2016 05:25 AM    Glucose 189 03/16/2016 09:40 AM    BUN 12 03/16/2016 09:40 AM    Creatinine 0.80 03/16/2016 09:40 AM    BUN/Creatinine ratio 15 03/16/2016 09:40 AM    GFR est AA 96 03/16/2016 09:40 AM    GFR est non-AA 83 03/16/2016 09:40 AM    Calcium 9.1 03/16/2016 09:40 AM    Bilirubin, total 0.3 03/16/2016 09:40 AM    AST (SGOT) 11 03/16/2016 09:40 AM    Alk. phosphatase 81 03/16/2016 09:40 AM    Protein, total 6.3 03/16/2016 09:40 AM    Albumin 3.9 03/16/2016  09:40 AM    Globulin 4.5 03/01/2016 02:40 PM    A-G Ratio 1.6 03/16/2016 09:40 AM    ALT (SGPT) 8 03/16/2016 09:40 AM          Assessment:       ICD-10-CM ICD-9-CM    1. Chronic combined systolic and diastolic congestive heart failure (HCC) I50.42 428.42 sacubitril-valsartan (ENTRESTO) 24 mg/26 mg tablet     428.0 spironolactone (ALDACTONE) 25 mg tablet    EF 30 % Sept 2017   2. Essential hypertension I10 401.9 sacubitril-valsartan (ENTRESTO) 24 mg/26 mg tablet      spironolactone (ALDACTONE) 25 mg tablet   3. Uncontrolled type 2 diabetes mellitus without complication, without  long-term current use of insulin (HCC) E11.65 250.02    4. COPD with acute exacerbation ([place]) J44.1 491.21          Discussion: Patient presents at this time stable from a cardiac perspective with moderately  Compensated combined systolic diastolic CHF, NYHA Class III. HTN not well controlled.       Plan:     1. -- STOP Lisinopril tonight!  -- STOP Clonidine and Amlodipine on Saturday  -- START Entresto on Saturday  -- START Spironlactone Saturday      2.Encouraged to exercise to tolerance, lose weight, stop smoking and follow low fat, low cholesterol, low sodium predominantly Plant-based (consider Mediterranean) diet.       3.Follow up: 1 month    I have discussed the diagnosis with the patient and the intended plan as seen in the above orders.  The patient has received an after-visit summary and questions were answered concerning future plans.  I have discussed any concerning medication side effects and warnings with the patient as well.    Lebron Conners, PA-C  05/05/2016

## 2016-05-05 NOTE — Patient Instructions (Addendum)
-- STOP Lisinopril tonight!  -- STOP Clonidine and Amlodipine on Saturday  -- START Entresto on Saturday  -- START Spironlactone Saturday    -- Please make a 1 month follow up    Heart Failure: Care Instructions  Your Care Instructions    Heart failure occurs when your heart does not pump as much blood as the body needs. Failure does not mean that the heart has stopped pumping but rather that it is not pumping as well as it should. Over time, this causes fluid buildup in your lungs and other parts of your body. Fluid buildup can cause shortness of breath, fatigue, swollen ankles, and other problems. By taking medicines regularly, reducing sodium (salt) in your diet, checking your weight every day, and making lifestyle changes, you can feel better and live longer.  Follow-up care is a key part of your treatment and safety. Be sure to make and go to all appointments, and call your doctor if you are having problems. It's also a good idea to know your test results and keep a list of the medicines you take.  How can you care for yourself at home?  Medicines  ?? Be safe with medicines. Take your medicines exactly as prescribed. Call your doctor if you think you are having a problem with your medicine.  ?? Do not take any vitamins, over-the-counter medicine, or herbal products without talking to your doctor first. Do not take ibuprofen (Advil or Motrin) and naproxen (Aleve) without talking to your doctor first. They could make your heart failure worse.  ?? You may be taking some of the following medicine.  ?? Beta-blockers can slow heart rate, decrease blood pressure, and improve your condition. Taking a beta-blocker may lower your chance of needing to be hospitalized.  ?? Angiotensin-converting enzyme inhibitors (ACEIs) reduce the heart's workload, lower blood pressure, and reduce swelling. Taking an ACEI may lower your chance of needing to be hospitalized again.   ?? Angiotensin II receptor blockers (ARBs) work like ACEIs. Your doctor may prescribe them instead of ACEIs.  ?? Diuretics, also called water pills, reduce swelling.  ?? Potassium supplements replace this important mineral, which is sometimes lost with diuretics.  ?? Aspirin and other blood thinners prevent blood clots, which can cause a stroke or heart attack.  You will get more details on the specific medicines your doctor prescribes.  Diet  ?? Your doctor may suggest that you limit sodium to 2,000 milligrams (mg) a day or less. That is less than 1 teaspoon of salt a day, including all the salt you eat in cooking or in packaged foods. People get most of their sodium from processed foods. Fast food and restaurant meals also tend to be very high in sodium.  ?? Ask your doctor how much liquid you can drink each day. You may have to limit liquids.  Weight  ?? Weigh yourself without clothing at the same time each day. Record your weight. Call your doctor if you have a sudden weight gain, such as more than 2 to 3 pounds in a day or 5 pounds in a week. (Your doctor may suggest a different range of weight gain.) A sudden weight gain may mean that your heart failure is getting worse.  Activity level  ?? Start light exercise (if your doctor says it is okay). Even if you can only do a small amount, exercise will help you get stronger, have more energy, and manage your weight and your stress. Walking is an easy  way to get exercise. Start out by walking a little more than you did before. Bit by bit, increase the amount you walk.  ?? When you exercise, watch for signs that your heart is working too hard. You are pushing yourself too hard if you cannot talk while you are exercising. If you become short of breath or dizzy or have chest pain, stop, sit down, and rest.  ?? If you feel "wiped out" the day after you exercise, walk slower or for a shorter distance until you can work up to a better pace.   ?? Get enough rest at night. Sleeping with 1 or 2 pillows under your upper body and head may help you breathe easier.  Lifestyle changes  ?? Do not smoke. Smoking can make a heart condition worse. If you need help quitting, talk to your doctor about stop-smoking programs and medicines. These can increase your chances of quitting for good. Quitting smoking may be the most important step you can take to protect your heart.  ?? Limit alcohol to 2 drinks a day for men and 1 drink a day for women. Too much alcohol can cause health problems.  ?? Avoid getting sick from colds and the flu. Get a pneumococcal vaccine shot. If you have had one before, ask your doctor whether you need another dose. Get a flu shot each year. If you must be around people with colds or the flu, wash your hands often.  When should you call for help?  Call 911 if you have symptoms of sudden heart failure such as:  ?? You have severe trouble breathing.  ?? You cough up pink, foamy mucus.  ?? You have a new irregular or rapid heartbeat.  Call your doctor now or seek immediate medical care if:  ?? You have new or increased shortness of breath.  ?? You are dizzy or lightheaded, or you feel like you may faint.  ?? You have sudden weight gain, such as more than 2 to 3 pounds in a day or 5 pounds in a week. (Your doctor may suggest a different range of weight gain.)  ?? You have increased swelling in your legs, ankles, or feet.  ?? You are suddenly so tired or weak that you cannot do your usual activities.  Watch closely for changes in your health, and be sure to contact your doctor if:  ?? You develop new symptoms.  Where can you learn more?  Go to [employer].  Enter [phone] in the search box to learn more about "Heart Failure: Care Instructions."  Current as of: November 17, 2015  Content Version: 11.3  ?? 2006-2017 [employer]. Care instructions adapted under  license by [employer] (which disclaims liability or warranty for this information). If you have questions about a medical condition or this instruction, always ask your healthcare professional. [place] any warranty or liability for your use of this information.

## 2016-05-05 NOTE — Telephone Encounter (Signed)
Patient has an appointment with the psychiatrist in October.

## 2016-05-06 NOTE — Progress Notes (Signed)
Pt given samples of Entresto 24 mg/26 mg tablets on 05/05/2016. LOT [id number]; EXP Oct 2018. Qty 14 tablets.     Lebron Conners, PA-C

## 2016-05-11 ENCOUNTER — Encounter: Primary: Family

## 2016-05-24 MED ORDER — BUPROPION XL 300 MG 24 HR TAB
300 mg | ORAL_TABLET | ORAL | 2 refills | Status: DC
Start: 2016-05-24 — End: 2016-11-11

## 2016-06-07 ENCOUNTER — Encounter: Attending: Psychiatric/Mental Health | Primary: Family

## 2016-06-09 ENCOUNTER — Ambulatory Visit: Attending: Cardiovascular Disease | Primary: Family

## 2016-06-09 NOTE — Patient Instructions (Signed)
If you are unable to keep an appointment, please be so kind as to let us know so that someone else can use your slot

## 2016-06-09 NOTE — Progress Notes (Signed)
Ms. Gilliard did not keep this appointment

## 2016-06-16 ENCOUNTER — Encounter: Attending: Family | Primary: Family

## 2016-07-12 ENCOUNTER — Encounter (HOSPITAL_COMMUNITY): Payer: Self-pay | Admitting: *Deleted

## 2016-07-12 ENCOUNTER — Emergency Department (HOSPITAL_COMMUNITY)
Admission: EM | Admit: 2016-07-12 | Discharge: 2016-07-12 | Disposition: A | Discharge status: Home / Self Care | Payer: Medicaid - Out of State | Attending: Emergency Medicine | Admitting: Emergency Medicine

## 2016-07-12 ENCOUNTER — Emergency Department (HOSPITAL_COMMUNITY): Payer: Medicaid - Out of State

## 2016-07-12 DIAGNOSIS — F17210 Nicotine dependence, cigarettes, uncomplicated: Secondary | ICD-10-CM | POA: Insufficient documentation

## 2016-07-12 DIAGNOSIS — J069 Acute upper respiratory infection, unspecified: Secondary | ICD-10-CM | POA: Diagnosis not present

## 2016-07-12 DIAGNOSIS — Z79899 Other long term (current) drug therapy: Secondary | ICD-10-CM | POA: Insufficient documentation

## 2016-07-12 DIAGNOSIS — J449 Chronic obstructive pulmonary disease, unspecified: Secondary | ICD-10-CM | POA: Diagnosis not present

## 2016-07-12 DIAGNOSIS — Z7984 Long term (current) use of oral hypoglycemic drugs: Secondary | ICD-10-CM | POA: Diagnosis not present

## 2016-07-12 DIAGNOSIS — I10 Essential (primary) hypertension: Secondary | ICD-10-CM | POA: Insufficient documentation

## 2016-07-12 DIAGNOSIS — R21 Rash and other nonspecific skin eruption: Secondary | ICD-10-CM | POA: Diagnosis not present

## 2016-07-12 DIAGNOSIS — Z8673 Personal history of transient ischemic attack (TIA), and cerebral infarction without residual deficits: Secondary | ICD-10-CM | POA: Insufficient documentation

## 2016-07-12 DIAGNOSIS — E1165 Type 2 diabetes mellitus with hyperglycemia: Secondary | ICD-10-CM | POA: Insufficient documentation

## 2016-07-12 DIAGNOSIS — R739 Hyperglycemia, unspecified: Secondary | ICD-10-CM

## 2016-07-12 DIAGNOSIS — R05 Cough: Secondary | ICD-10-CM | POA: Diagnosis present

## 2016-07-12 LAB — BASIC METABOLIC PANEL
Anion gap: 10 (ref 5–15)
BUN: 10 mg/dL (ref 6–20)
CALCIUM: 9.4 mg/dL (ref 8.9–10.3)
CHLORIDE: 101 mmol/L (ref 101–111)
CO2: 24 mmol/L (ref 22–32)
CREATININE: 0.93 mg/dL (ref 0.44–1.00)
GFR calc Af Amer: >60 mL/min (ref >60)
GFR calc non Af Amer: >60 mL/min (ref >60)
GLUCOSE: 345 mg/dL — AB (ref 65–99)
Potassium: 4.4 mmol/L (ref 3.5–5.1)
Sodium: 135 mmol/L (ref 135–145)

## 2016-07-12 LAB — CBC
HCT: 45.6 % (ref 36.0–46.0)
Hemoglobin: 14.8 g/dL (ref 12.0–15.0)
MCH: 29.6 pg (ref 26.0–34.0)
MCHC: 32.5 g/dL (ref 30.0–36.0)
MCV: 91.2 fL (ref 78.0–100.0)
PLATELETS: 267 10*3/uL (ref 150–400)
RBC: 5 MIL/uL (ref 3.87–5.11)
RDW: 14.2 % (ref 11.5–15.5)
WBC: 5.2 10*3/uL (ref 4.0–10.5)

## 2016-07-12 LAB — CBG MONITORING, ED: Glucose-Capillary: 245 mg/dL — ABNORMAL HIGH (ref 65–99)

## 2016-07-12 MED ORDER — INSULIN ASPART 100 UNIT/ML ~~LOC~~ SOLN
6.0000 [IU] | Freq: Once | SUBCUTANEOUS | Status: AC
  Administered 2016-07-12: 6 [IU] via SUBCUTANEOUS
  Filled 2016-07-12: qty 1

## 2016-07-12 MED ORDER — AZITHROMYCIN 250 MG PO TABS: 250.0000 mg | ORAL_TABLET | Freq: Every day | ORAL | 0 refills | Status: AC

## 2016-07-12 MED ORDER — OXYMETAZOLINE HCL 0.05 % NA SOLN: 1.0000 | Freq: Two times a day (BID) | NASAL | 0 refills | Status: AC

## 2016-07-12 MED ORDER — HYDROXYZINE HCL 10 MG PO TABS: 10.0000 mg | ORAL_TABLET | Freq: Four times a day (QID) | ORAL | 0 refills | Status: AC | PRN

## 2016-07-12 MED ORDER — ALBUTEROL SULFATE (2.5 MG/3ML) 0.083% IN NEBU
5.0000 mg | INHALATION_SOLUTION | Freq: Once | RESPIRATORY_TRACT | Status: AC
  Administered 2016-07-12: 5 mg via RESPIRATORY_TRACT

## 2016-07-12 MED ORDER — ALBUTEROL SULFATE (2.5 MG/3ML) 0.083% IN NEBU
INHALATION_SOLUTION | RESPIRATORY_TRACT | Status: AC
  Administered 2016-07-12: 13:00:00
  Filled 2016-07-12: qty 6

## 2016-07-12 MED ORDER — BENZONATATE 100 MG PO CAPS: 200.0000 mg | ORAL_CAPSULE | Freq: Two times a day (BID) | ORAL | 0 refills | Status: AC | PRN

## 2016-07-12 NOTE — ED Provider Notes (Signed)
[facility]-EMERGENCY DEPT Provider Note   CSN: [id number] Arrival date & time: 07/12/16  1043     History   Chief Complaint Chief Complaint  Patient presents with  . Cough  . Asthma    HPI Susan GongCarla Mcclure is a 55 y.o. female.  HPI  Patient is a 55 year old female with history of COPD, hypertension, diabetes, TIA who presents to the ED with complaint of worsening cough, onset one week. Patient reports she has had a worsening productive cough with yellow/brown sputum with associated nasal congestion, rhinorrhea, ear congestion, wheezing, sore throat. Patient reports she has mild discomfort to bilateral lower ribs that is only present with coughing. Reports taking TheraFlu at home without relief. Patient reports having mild shortness of breath which she reports is consistent with her typical shortness of breath related to her COPD. Denies fever, chills, chest pain, palpitations, abdominal pain, vomiting, leg swelling. Denies any known sick contacts. Patient reports she was last hospitalized the end of July for pneumonia in [place]. Denies any other recent hospitalizations or recent antibiotic use. Reports typically smoking 1PPD but notes she has only been smoking 1/2 PPD since onset of sxs.   Pt also presents with red itchy bumps to her back that have been present since she was at her family member's house last week. Pt reports she thinks her aunt has bed bugs because she also had similar rash. Denies fever, drainage, mouth lesions. Denies use of any new soaps, lotions, detergents, medications.   Past Medical History:  Diagnosis Date  . Anxiety   . Asthma   . Chronic pain of left ankle   . Diabetes mellitus without complication (HCC)   . Emphysema/COPD (HCC)   . H/O suicide attempt    remote past  . Heart disease   . Heart murmur   . Heavy smoker   . Hx of TIA (transient ischemic attack) and stroke   . Hyperlipidemia   . Hypertension   . Insomnia     Patient Active Problem List   Diagnosis Date Noted  . Type II diabetes mellitus, uncontrolled (HCC) 02/28/2015  . Diarrhea 02/28/2015  . Side effects of treatment 02/28/2015  . COPD, severity to be determined (HCC) 02/28/2015  . Chronic ankle pain 02/20/2015  . Generalized anxiety disorder 02/20/2015  . Essential hypertension 02/20/2015  . Heart disease 02/20/2015  . Hyperlipidemia 02/20/2015  . Heavy smoker 02/20/2015  . History of TIA (transient ischemic attack) 02/20/2015    History reviewed. No pertinent surgical history.  OB History    No data available       Home Medications    Prior to Admission medications   Medication Sig Start Date End Date Taking? Authorizing Provider  albuterol (PROVENTIL HFA;VENTOLIN HFA) 108 (90 BASE) MCG/ACT inhaler Inhale 2 puffs into the lungs every 6 (six) hours as needed for wheezing or shortness of breath. 04/29/15  Yes Kermit Baloavid S Tysinger, PA-C  buPROPion (WELLBUTRIN XL) 300 MG 24 hr tablet Take 1 tablet (300 mg total) by mouth daily. 05/02/15  Yes Kermit Baloavid S Tysinger, PA-C  empagliflozin (JARDIANCE) 25 MG TABS tablet Take 25 mg by mouth daily. 05/02/15  Yes David S Tysinger, PA-C  ENTRESTO 24-26 MG Take 1 tablet by mouth 2 (two) times daily.  05/28/16  Yes Historical Provider, MD  Fluticasone Furoate-Vilanterol (BREO ELLIPTA) 200-25 MCG/INH AEPB Inhale 1 puff into the lungs daily. 05/02/15  Yes Kermit Baloavid S Tysinger, PA-C  glipiZIDE (GLUCOTROL) 5 MG tablet Take 1 tablet (5 mg total) by mouth 2 (  two) times daily before a meal. 05/02/15  Yes Kermit Baloavid S Tysinger, PA-C  rosuvastatin (CRESTOR) 20 MG tablet Take 20 mg by mouth at bedtime. 05/19/16  Yes Historical Provider, MD  spironolactone (ALDACTONE) 25 MG tablet Take 25 mg by mouth daily. 05/05/16  Yes Historical Provider, MD  traMADol (ULTRAM) 50 MG tablet Take 1 tablet (50 mg total) by mouth every 12 (twelve) hours as needed. 05/02/15  Yes Kermit Baloavid S Tysinger, PA-C  ALPRAZolam Prudy Feeler(XANAX) 0.5 MG tablet Take 1 tablet (0.5 mg total) by mouth at bedtime  as needed for anxiety. Patient not taking: Reported on 07/12/2016 05/02/15   Kermit Baloavid S Tysinger, PA-C  amLODipine (NORVASC) 5 MG tablet Take 1 tablet (5 mg total) by mouth daily. Patient not taking: Reported on 07/12/2016 05/02/15   Kermit Baloavid S Tysinger, PA-C  azithromycin (ZITHROMAX) 250 MG tablet Take 1 tablet (250 mg total) by mouth daily. Take first 2 tablets together, then 1 every day until finished. 07/12/16   Barrett HenleNicole Elizabeth Deandrew Hoecker, PA-C  benzonatate (TESSALON) 100 MG capsule Take 2 capsules (200 mg total) by mouth 2 (two) times daily as needed for cough. 07/12/16   Barrett HenleNicole Elizabeth Savior Himebaugh, PA-C  cloNIDine (CATAPRES) 0.1 MG tablet Take 1 tablet (0.1 mg total) by mouth at bedtime. Patient not taking: Reported on 07/12/2016 05/02/15   Kermit Baloavid S Tysinger, PA-C  hydrOXYzine (ATARAX/VISTARIL) 10 MG tablet Take 1 tablet (10 mg total) by mouth every 6 (six) hours as needed for itching. 07/12/16   Barrett HenleNicole Elizabeth Cicero Noy, PA-C  lisinopril-hydrochlorothiazide (ZESTORETIC) 20-12.5 MG per tablet Take 1 tablet by mouth daily. Patient not taking: Reported on 07/12/2016 05/02/15   Kermit Baloavid S Tysinger, PA-C  oxymetazoline (AFRIN NASAL SPRAY) 0.05 % nasal spray Place 1 spray into both nostrils 2 (two) times daily. Spray once into each nostril twice daily for up to the next 3 days. Do not use for more than 3 days to prevent rebound rhinorrhea. 07/12/16   Barrett HenleNicole Elizabeth Montina Dorrance, PA-C    Family History No family history on file.  Social History Social History  Substance Use Topics  . Smoking status: Current Every Day Smoker    Packs/day: 1.00    Years: 40.00    Types: Cigarettes  . Smokeless tobacco: Never Used  . Alcohol use No     Allergies   Metformin and related   Review of Systems Review of Systems  HENT: Positive for congestion, rhinorrhea and sore throat.   Respiratory: Positive for cough and wheezing.   All other systems reviewed and are negative.    Physical Exam Updated Vital Signs BP (!)  165/104 (BP Location: Right Arm)   Pulse 78   Temp 98 F (36.7 C) (Oral)   Resp 16   SpO2 98%   Physical Exam  Constitutional: She is oriented to person, place, and time. She appears well-developed and well-nourished. No distress.  HENT:  Head: Normocephalic and atraumatic.  Mouth/Throat: Uvula is midline, oropharynx is clear and moist and mucous membranes are normal. No oropharyngeal exudate, posterior oropharyngeal edema, posterior oropharyngeal erythema or tonsillar abscesses. No tonsillar exudate.  Eyes: Conjunctivae and EOM are normal. Right eye exhibits no discharge. Left eye exhibits no discharge. No scleral icterus.  Neck: Normal range of motion. Neck supple.  Cardiovascular: Normal rate, regular rhythm, normal heart sounds and intact distal pulses.   Pulmonary/Chest: Effort normal and breath sounds normal. No respiratory distress. She has no wheezes. She has no rales. She exhibits no tenderness.  Abdominal: Soft. Bowel sounds are normal.  She exhibits no distension. There is no tenderness.  Musculoskeletal: Normal range of motion. She exhibits no edema.  Lymphadenopathy:    She has no cervical adenopathy.  Neurological: She is alert and oriented to person, place, and time.  Skin: Skin is warm and dry. She is not diaphoretic.  Few scattered erythematous blanching macules noted to back with excoriations present. No drainage, vesicles, pustules, bulla. No lesions on palms or soles.  Nursing note and vitals reviewed.    ED Treatments / Results  Labs (all labs ordered are listed, but only abnormal results are displayed) Labs Reviewed  BASIC METABOLIC PANEL - Abnormal; Notable for the following:       Result Value   Glucose, Bld 345 (*)    All other components within normal limits  CBG MONITORING, ED - Abnormal; Notable for the following:    Glucose-Capillary 245 (*)    All other components within normal limits  CBC    EKG  EKG Interpretation None       Radiology Dg  Chest 2 View  Result Date: 07/12/2016 CLINICAL DATA:  Cough, cold for 1 week EXAM: CHEST  2 VIEW COMPARISON:  01/31/2015 FINDINGS: Normal mediastinum and cardiac silhouette. Normal pulmonary vasculature. No evidence of effusion, infiltrate, or pneumothorax. No acute bony abnormality. IMPRESSION: No acute cardiopulmonary process. Electronically Signed   By: Genevive Bi M.D.   On: 07/12/2016 13:56    Procedures Procedures (including critical care time)  Medications Ordered in ED Medications  albuterol (PROVENTIL) (2.5 MG/3ML) 0.083% nebulizer solution 5 mg (5 mg Nebulization Given 07/12/16 1125)  albuterol (PROVENTIL) (2.5 MG/3ML) 0.083% nebulizer solution (  Given by Other 07/12/16 1255)  insulin aspart (novoLOG) injection 6 Units (6 Units Subcutaneous Given 07/12/16 1454)     Initial Impression / Assessment and Plan / ED Course  I have reviewed the triage vital signs and the nursing notes.  Pertinent labs & imaging results that were available during my care of the patient were reviewed by me and considered in my medical decision making (see chart for details).  Clinical Course    Pt presents with worsening cough, nasal congestion, rhinorrhea, wheezing for the past week. Denies fever, CP. VSS. Pt given albuterol neb in triage. Exam unremarkable. Lungs CTAB. Pt reports improvement of wheezing since neb tx. CXR negative. Suspect URI, likely viral, however due to pt reporting worsening of sxs over the past week will d/c home with antibiotics and symptomatic tx. Discussed results and plan for d/c with pt. Discussed return precautions.    Glucose 345, no AG. Remaining labs unremarkable. Pt reports taking her home DM meds as prescribed but reports her glucose has been 200-400 over the past few weeks. Pt given 6U insulin in the ED. Repeat CGB 245. Plan to d/c pt home, advised to continue taking her home meds as prescribed and given info to follow up with PCP. Discussed return precautions.    Rash consistent with urticaria vs bug bites. No blisters, no pustules, no warmth, no draining sinus tracts, no superficial abscesses, no bullous impetigo, no vesicles, no desquamation, no target lesions with dusky purpura or a central bulla. Not tender to touch. No concern for superimposed infection. No concern for SJS, TEN, TSS, tick borne illness, syphilis or other life-threatening condition. Will discharge home with vistaril and return precautions given.     Final Clinical Impressions(s) / ED Diagnoses   Final diagnoses:  Upper respiratory tract infection, unspecified type  Hyperglycemia  Rash    New  Prescriptions Discharge Medication List as of 07/12/2016  3:39 PM    START taking these medications   Details  azithromycin (ZITHROMAX) 250 MG tablet Take 1 tablet (250 mg total) by mouth daily. Take first 2 tablets together, then 1 every day until finished., Starting Mon 07/12/2016, Print    benzonatate (TESSALON) 100 MG capsule Take 2 capsules (200 mg total) by mouth 2 (two) times daily as needed for cough., Starting Mon 07/12/2016, Print    oxymetazoline (AFRIN NASAL SPRAY) 0.05 % nasal spray Place 1 spray into both nostrils 2 (two) times daily. Spray once into each nostril twice daily for up to the next 3 days. Do not use for more than 3 days to prevent rebound rhinorrhea., Starting Mon 07/12/2016, Print            Satira Sark [place], [place] 07/12/16 1559    Canary Brim Tegeler, MD 07/12/16 2100

## 2016-07-12 NOTE — ED Triage Notes (Signed)
Pt is here with productive cough, chills, and thinks she needs nebulizer.  Lungs sounds:  Crackles to LLL.  SOB. Ribs hurt with coughing

## 2016-07-12 NOTE — Discharge Instructions (Signed)
Take your medication as prescribed. Continue drinking fluids at home to remain hydrated. Please follow up with a primary care provider from the Resource Guide provided below in 4-5 days if your symptoms have not improved. Please return to the Emergency Department if symptoms worsen or new onset of fever, chest pain, difficulty breathing, coughing up blood, abdominal pain, vomiting.

## 2016-07-17 ENCOUNTER — Encounter

## 2016-07-18 ENCOUNTER — Encounter

## 2016-07-20 ENCOUNTER — Encounter

## 2016-07-20 MED ORDER — ROSUVASTATIN 20 MG TAB
20 mg | ORAL_TABLET | ORAL | 0 refills | Status: AC
Start: 2016-07-20 — End: ?

## 2016-07-20 MED ORDER — CHOLECALCIFEROL (VITAMIN D3) 1,000 UNIT (25 MCG) TAB
ORAL_TABLET | Freq: Every day | ORAL | 11 refills | Status: AC
Start: 2016-07-20 — End: ?

## 2016-07-20 NOTE — Telephone Encounter (Signed)
I sent a daily vit D that she can take, she does not need this medication again

## 2016-07-20 NOTE — Telephone Encounter (Signed)
Follow up apt needed for fasting labs and cholesterol recheck

## 2016-07-23 ENCOUNTER — Encounter

## 2016-07-23 MED ORDER — JANUMET 50 MG-1,000 MG TABLET
50-1000 mg | ORAL_TABLET | ORAL | 1 refills | Status: AC
Start: 2016-07-23 — End: ?

## 2016-10-20 DIAGNOSIS — I10 Essential (primary) hypertension: Secondary | ICD-10-CM | POA: Diagnosis not present

## 2016-10-20 DIAGNOSIS — E1165 Type 2 diabetes mellitus with hyperglycemia: Secondary | ICD-10-CM | POA: Diagnosis not present

## 2016-10-20 DIAGNOSIS — E1169 Type 2 diabetes mellitus with other specified complication: Secondary | ICD-10-CM | POA: Diagnosis not present

## 2016-10-28 DIAGNOSIS — R0602 Shortness of breath: Secondary | ICD-10-CM | POA: Diagnosis not present

## 2016-10-28 DIAGNOSIS — I5022 Chronic systolic (congestive) heart failure: Secondary | ICD-10-CM | POA: Diagnosis not present

## 2016-10-28 DIAGNOSIS — R072 Precordial pain: Secondary | ICD-10-CM | POA: Diagnosis not present

## 2016-10-28 DIAGNOSIS — I255 Ischemic cardiomyopathy: Secondary | ICD-10-CM | POA: Diagnosis not present

## 2016-11-03 DIAGNOSIS — R918 Other nonspecific abnormal finding of lung field: Secondary | ICD-10-CM | POA: Diagnosis not present

## 2016-11-08 DIAGNOSIS — E1165 Type 2 diabetes mellitus with hyperglycemia: Secondary | ICD-10-CM | POA: Diagnosis not present

## 2016-11-08 DIAGNOSIS — J441 Chronic obstructive pulmonary disease with (acute) exacerbation: Secondary | ICD-10-CM | POA: Diagnosis not present

## 2016-11-08 DIAGNOSIS — F17200 Nicotine dependence, unspecified, uncomplicated: Secondary | ICD-10-CM | POA: Diagnosis not present

## 2016-11-08 DIAGNOSIS — I509 Heart failure, unspecified: Secondary | ICD-10-CM | POA: Diagnosis not present

## 2016-11-11 MED ORDER — BUPROPION XL 300 MG 24 HR TAB
300 mg | ORAL_TABLET | ORAL | 1 refills | Status: AC
Start: 2016-11-11 — End: ?

## 2016-11-11 NOTE — Telephone Encounter (Signed)
Please have patient schedule chronic care follow up

## 2016-11-12 DIAGNOSIS — I255 Ischemic cardiomyopathy: Secondary | ICD-10-CM | POA: Diagnosis not present

## 2016-11-12 DIAGNOSIS — I5022 Chronic systolic (congestive) heart failure: Secondary | ICD-10-CM | POA: Diagnosis not present

## 2016-11-12 DIAGNOSIS — R0602 Shortness of breath: Secondary | ICD-10-CM | POA: Diagnosis not present

## 2016-11-16 DIAGNOSIS — M79606 Pain in leg, unspecified: Secondary | ICD-10-CM | POA: Diagnosis not present

## 2016-11-16 DIAGNOSIS — I739 Peripheral vascular disease, unspecified: Secondary | ICD-10-CM | POA: Diagnosis not present

## 2016-11-29 DIAGNOSIS — J449 Chronic obstructive pulmonary disease, unspecified: Secondary | ICD-10-CM | POA: Diagnosis not present

## 2016-11-30 DIAGNOSIS — I5022 Chronic systolic (congestive) heart failure: Secondary | ICD-10-CM | POA: Diagnosis not present

## 2016-11-30 DIAGNOSIS — I255 Ischemic cardiomyopathy: Secondary | ICD-10-CM | POA: Diagnosis not present

## 2016-11-30 DIAGNOSIS — E1165 Type 2 diabetes mellitus with hyperglycemia: Secondary | ICD-10-CM | POA: Diagnosis not present

## 2016-11-30 DIAGNOSIS — R42 Dizziness and giddiness: Secondary | ICD-10-CM | POA: Diagnosis not present

## 2016-11-30 DIAGNOSIS — R0602 Shortness of breath: Secondary | ICD-10-CM | POA: Diagnosis not present

## 2016-11-30 DIAGNOSIS — R0989 Other specified symptoms and signs involving the circulatory and respiratory systems: Secondary | ICD-10-CM | POA: Diagnosis not present

## 2016-11-30 DIAGNOSIS — Z79899 Other long term (current) drug therapy: Secondary | ICD-10-CM | POA: Diagnosis not present

## 2016-11-30 DIAGNOSIS — R9431 Abnormal electrocardiogram [ECG] [EKG]: Secondary | ICD-10-CM | POA: Diagnosis not present

## 2016-12-01 DIAGNOSIS — E1169 Type 2 diabetes mellitus with other specified complication: Secondary | ICD-10-CM | POA: Diagnosis not present

## 2016-12-01 DIAGNOSIS — I10 Essential (primary) hypertension: Secondary | ICD-10-CM | POA: Diagnosis not present

## 2016-12-01 DIAGNOSIS — E782 Mixed hyperlipidemia: Secondary | ICD-10-CM | POA: Diagnosis not present

## 2016-12-01 DIAGNOSIS — E1165 Type 2 diabetes mellitus with hyperglycemia: Secondary | ICD-10-CM | POA: Diagnosis not present

## 2016-12-06 DIAGNOSIS — I509 Heart failure, unspecified: Secondary | ICD-10-CM | POA: Diagnosis not present

## 2016-12-06 DIAGNOSIS — J441 Chronic obstructive pulmonary disease with (acute) exacerbation: Secondary | ICD-10-CM | POA: Diagnosis not present

## 2016-12-06 DIAGNOSIS — E1165 Type 2 diabetes mellitus with hyperglycemia: Secondary | ICD-10-CM | POA: Diagnosis not present

## 2016-12-06 DIAGNOSIS — I129 Hypertensive chronic kidney disease with stage 1 through stage 4 chronic kidney disease, or unspecified chronic kidney disease: Secondary | ICD-10-CM | POA: Diagnosis not present

## 2016-12-07 DIAGNOSIS — N186 End stage renal disease: Secondary | ICD-10-CM | POA: Diagnosis not present

## 2016-12-07 DIAGNOSIS — N189 Chronic kidney disease, unspecified: Secondary | ICD-10-CM | POA: Diagnosis not present

## 2016-12-07 DIAGNOSIS — E1122 Type 2 diabetes mellitus with diabetic chronic kidney disease: Secondary | ICD-10-CM | POA: Diagnosis not present

## 2016-12-08 NOTE — Progress Notes (Signed)
9:32 AM    Pt has moved to [place] and established care with cardiology. However, I received a Prior Auth from her new pharmacy for [employer]. I have completed it as a courtesy for the pt but she will need to have refills managed by her new provider. We have not seen her since her initial visit with [place] in September 2017. At that time a prescription for the 24 mg /26 mg dose was written with 11 refills as pt was not sure where she would be following up.    Lebron Conners, PA-C

## 2016-12-08 NOTE — Telephone Encounter (Signed)
-----   Message from Lebron Conners, PA-C sent at 12/08/2016  8:57 AM EDT -----  Got a pre-auth request on [employer] for Kathy Bush. We haven't seen her since September. I think she moved to [place].     Would you please call her and ask if she plans to establish care with Cardiology in [facility] or will she be traveling back and forth.     If she will be back in [place], we will need to set up a follow up as soon as possible.    Thanks,     Lebron Conners, PA-C

## 2016-12-08 NOTE — Telephone Encounter (Signed)
Spoke with pt . Verified 2 identifers.  Asked pt has she moved to [place] and will she be coming back to continue her care with our office here in [place] pt stated she has establish care with Cardiology in [facility]

## 2016-12-14 DIAGNOSIS — I255 Ischemic cardiomyopathy: Secondary | ICD-10-CM | POA: Diagnosis not present

## 2016-12-14 DIAGNOSIS — I5022 Chronic systolic (congestive) heart failure: Secondary | ICD-10-CM | POA: Diagnosis not present

## 2016-12-14 DIAGNOSIS — R9431 Abnormal electrocardiogram [ECG] [EKG]: Secondary | ICD-10-CM | POA: Diagnosis not present

## 2016-12-14 DIAGNOSIS — R0602 Shortness of breath: Secondary | ICD-10-CM | POA: Diagnosis not present

## 2016-12-28 DIAGNOSIS — M722 Plantar fascial fibromatosis: Secondary | ICD-10-CM | POA: Diagnosis not present

## 2016-12-29 DIAGNOSIS — I200 Unstable angina: Secondary | ICD-10-CM | POA: Diagnosis not present

## 2016-12-29 DIAGNOSIS — I110 Hypertensive heart disease with heart failure: Secondary | ICD-10-CM | POA: Diagnosis not present

## 2016-12-29 DIAGNOSIS — F419 Anxiety disorder, unspecified: Secondary | ICD-10-CM | POA: Diagnosis not present

## 2016-12-29 DIAGNOSIS — Z79899 Other long term (current) drug therapy: Secondary | ICD-10-CM | POA: Diagnosis not present

## 2016-12-29 DIAGNOSIS — E785 Hyperlipidemia, unspecified: Secondary | ICD-10-CM | POA: Diagnosis not present

## 2016-12-29 DIAGNOSIS — K219 Gastro-esophageal reflux disease without esophagitis: Secondary | ICD-10-CM | POA: Diagnosis not present

## 2016-12-29 DIAGNOSIS — R072 Precordial pain: Secondary | ICD-10-CM | POA: Diagnosis not present

## 2016-12-29 DIAGNOSIS — E119 Type 2 diabetes mellitus without complications: Secondary | ICD-10-CM | POA: Diagnosis not present

## 2016-12-29 DIAGNOSIS — F17210 Nicotine dependence, cigarettes, uncomplicated: Secondary | ICD-10-CM | POA: Diagnosis not present

## 2016-12-29 DIAGNOSIS — I5022 Chronic systolic (congestive) heart failure: Secondary | ICD-10-CM | POA: Diagnosis not present

## 2016-12-29 DIAGNOSIS — Z7951 Long term (current) use of inhaled steroids: Secondary | ICD-10-CM | POA: Diagnosis not present

## 2016-12-29 DIAGNOSIS — R0600 Dyspnea, unspecified: Secondary | ICD-10-CM | POA: Diagnosis not present

## 2016-12-29 DIAGNOSIS — I420 Dilated cardiomyopathy: Secondary | ICD-10-CM | POA: Diagnosis not present

## 2016-12-29 DIAGNOSIS — J449 Chronic obstructive pulmonary disease, unspecified: Secondary | ICD-10-CM | POA: Diagnosis not present

## 2016-12-29 DIAGNOSIS — Z888 Allergy status to other drugs, medicaments and biological substances status: Secondary | ICD-10-CM | POA: Diagnosis not present

## 2016-12-29 DIAGNOSIS — I255 Ischemic cardiomyopathy: Secondary | ICD-10-CM | POA: Diagnosis not present

## 2016-12-29 DIAGNOSIS — Z7982 Long term (current) use of aspirin: Secondary | ICD-10-CM | POA: Diagnosis not present

## 2016-12-29 DIAGNOSIS — N183 Chronic kidney disease, stage 3 (moderate): Secondary | ICD-10-CM | POA: Diagnosis not present

## 2016-12-29 DIAGNOSIS — I10 Essential (primary) hypertension: Secondary | ICD-10-CM | POA: Diagnosis not present

## 2017-01-10 IMAGING — CR DG CHEST 2V
2 series · 2 of 2 positions shown · non-contrast
Comparison: None.

CLINICAL DATA: Cough for 8 days.  Smoker.  Emphysema.

EXAM:
CHEST  2 VIEW

[w chest pa]
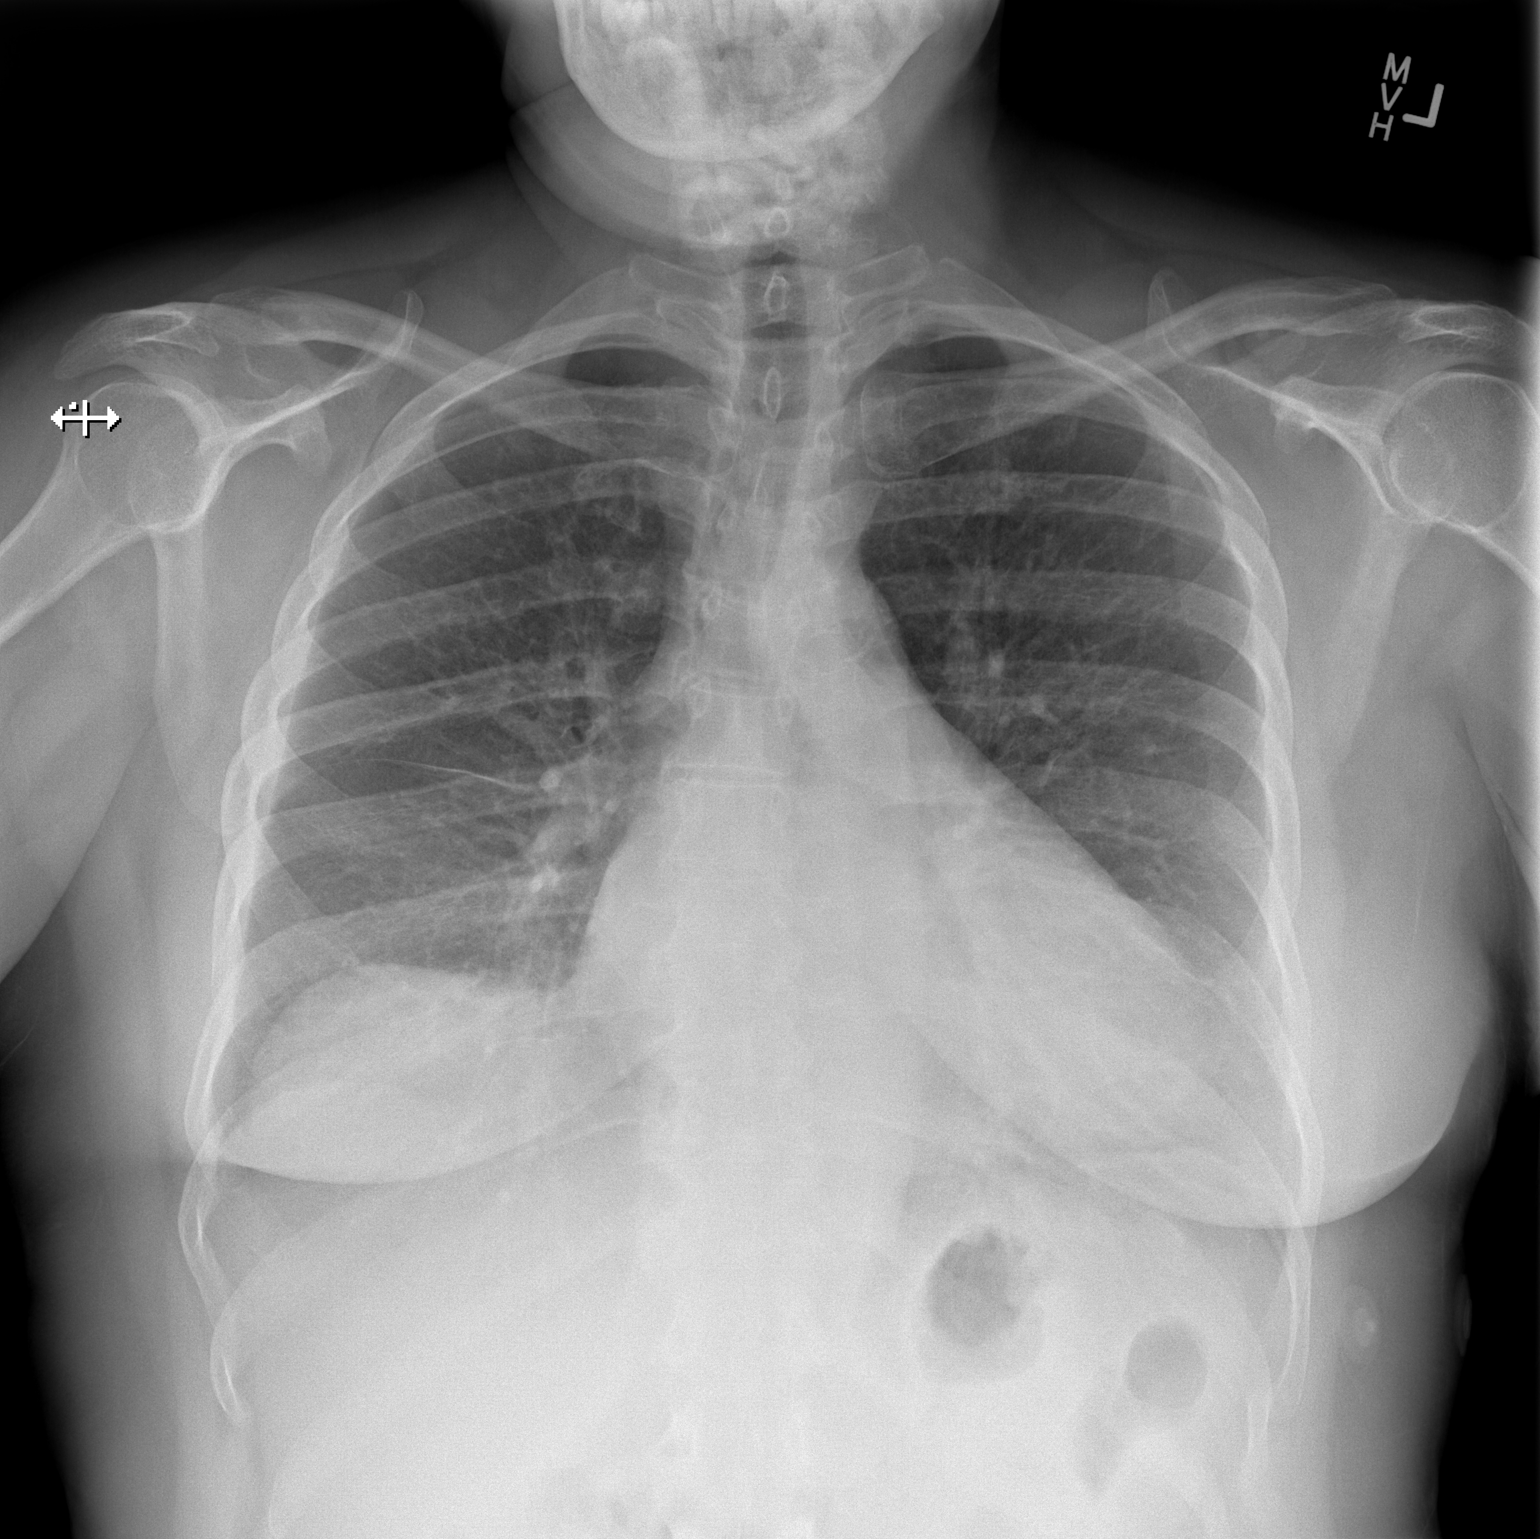

[w chest lat]
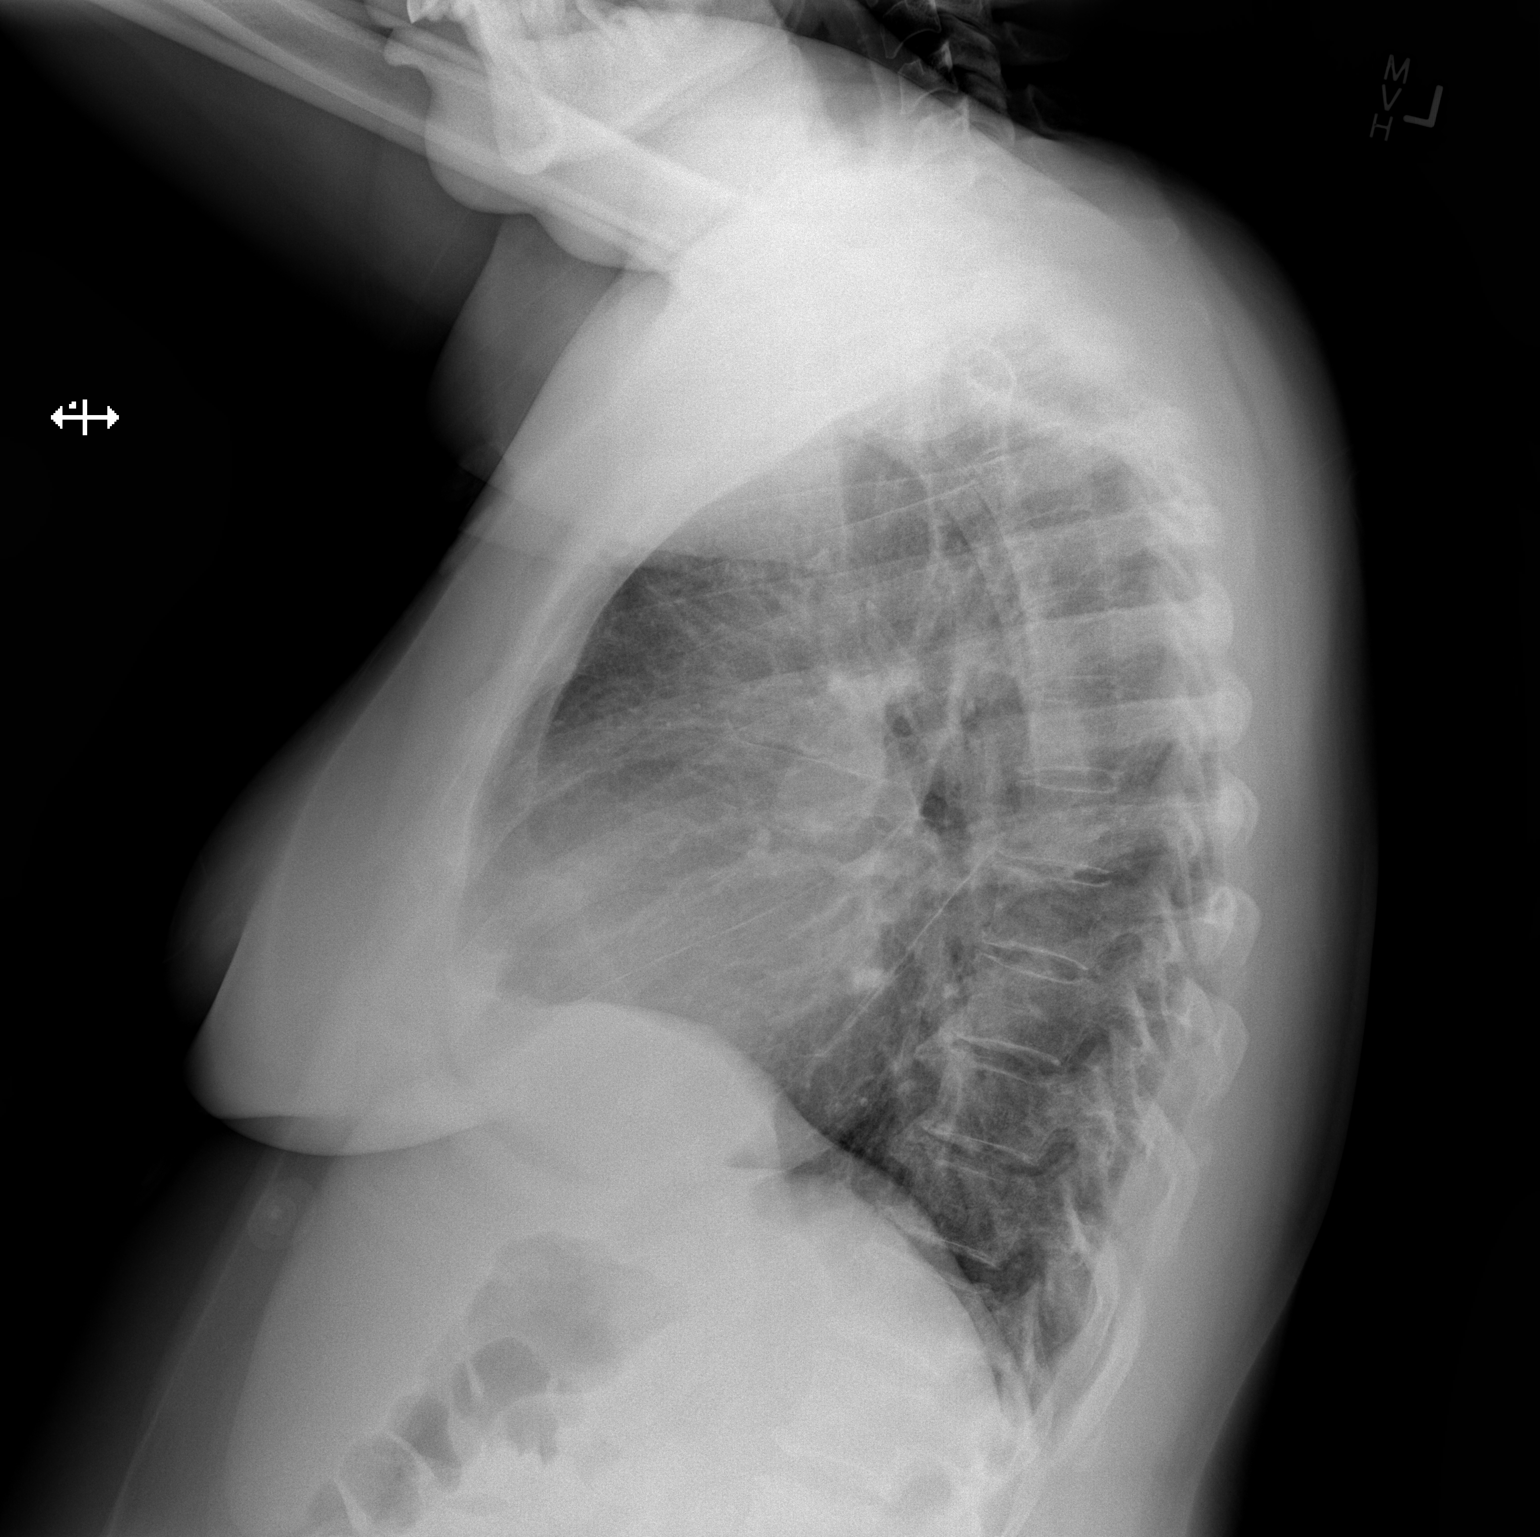

[2 of 2 positions shown; findings below may reference images not displayed]

FINDINGS: Low lung volumes. The heart size and mediastinal contours are within
normal limits. Both lungs are clear. The visualized skeletal
structures are unremarkable.
IMPRESSION: No active cardiopulmonary disease.

## 2017-01-14 DIAGNOSIS — I255 Ischemic cardiomyopathy: Secondary | ICD-10-CM | POA: Diagnosis not present

## 2017-01-14 DIAGNOSIS — I5022 Chronic systolic (congestive) heart failure: Secondary | ICD-10-CM | POA: Diagnosis not present

## 2017-01-14 DIAGNOSIS — R0602 Shortness of breath: Secondary | ICD-10-CM | POA: Diagnosis not present

## 2017-01-14 DIAGNOSIS — I10 Essential (primary) hypertension: Secondary | ICD-10-CM | POA: Diagnosis not present

## 2017-02-28 DIAGNOSIS — I509 Heart failure, unspecified: Secondary | ICD-10-CM | POA: Diagnosis not present

## 2017-02-28 DIAGNOSIS — E1169 Type 2 diabetes mellitus with other specified complication: Secondary | ICD-10-CM | POA: Diagnosis not present

## 2017-02-28 DIAGNOSIS — I10 Essential (primary) hypertension: Secondary | ICD-10-CM | POA: Diagnosis not present

## 2017-02-28 DIAGNOSIS — E782 Mixed hyperlipidemia: Secondary | ICD-10-CM | POA: Diagnosis not present

## 2017-02-28 DIAGNOSIS — E1165 Type 2 diabetes mellitus with hyperglycemia: Secondary | ICD-10-CM | POA: Diagnosis not present

## 2017-02-28 DIAGNOSIS — F17200 Nicotine dependence, unspecified, uncomplicated: Secondary | ICD-10-CM | POA: Diagnosis not present

## 2017-04-19 DIAGNOSIS — R0602 Shortness of breath: Secondary | ICD-10-CM | POA: Diagnosis not present

## 2017-04-19 DIAGNOSIS — R600 Localized edema: Secondary | ICD-10-CM | POA: Diagnosis not present

## 2017-04-19 DIAGNOSIS — Z716 Tobacco abuse counseling: Secondary | ICD-10-CM | POA: Diagnosis not present

## 2017-04-19 DIAGNOSIS — I10 Essential (primary) hypertension: Secondary | ICD-10-CM | POA: Diagnosis not present

## 2017-04-19 DIAGNOSIS — I5022 Chronic systolic (congestive) heart failure: Secondary | ICD-10-CM | POA: Diagnosis not present

## 2017-05-16 DIAGNOSIS — I10 Essential (primary) hypertension: Secondary | ICD-10-CM | POA: Diagnosis not present

## 2017-05-16 DIAGNOSIS — E119 Type 2 diabetes mellitus without complications: Secondary | ICD-10-CM | POA: Diagnosis not present

## 2017-05-16 DIAGNOSIS — I509 Heart failure, unspecified: Secondary | ICD-10-CM | POA: Diagnosis not present

## 2017-05-16 DIAGNOSIS — M25569 Pain in unspecified knee: Secondary | ICD-10-CM | POA: Diagnosis not present

## 2017-06-30 DIAGNOSIS — E1165 Type 2 diabetes mellitus with hyperglycemia: Secondary | ICD-10-CM | POA: Diagnosis not present

## 2017-06-30 DIAGNOSIS — E1169 Type 2 diabetes mellitus with other specified complication: Secondary | ICD-10-CM | POA: Diagnosis not present

## 2017-06-30 DIAGNOSIS — E782 Mixed hyperlipidemia: Secondary | ICD-10-CM | POA: Diagnosis not present

## 2017-06-30 DIAGNOSIS — I10 Essential (primary) hypertension: Secondary | ICD-10-CM | POA: Diagnosis not present

## 2017-07-04 DIAGNOSIS — E11319 Type 2 diabetes mellitus with unspecified diabetic retinopathy without macular edema: Secondary | ICD-10-CM | POA: Diagnosis not present

## 2017-07-04 DIAGNOSIS — H1851 Endothelial corneal dystrophy: Secondary | ICD-10-CM | POA: Diagnosis not present

## 2017-07-04 DIAGNOSIS — H538 Other visual disturbances: Secondary | ICD-10-CM | POA: Diagnosis not present

## 2017-07-26 DIAGNOSIS — R0602 Shortness of breath: Secondary | ICD-10-CM | POA: Diagnosis not present

## 2017-07-26 DIAGNOSIS — R072 Precordial pain: Secondary | ICD-10-CM | POA: Diagnosis not present

## 2017-07-26 DIAGNOSIS — I5022 Chronic systolic (congestive) heart failure: Secondary | ICD-10-CM | POA: Diagnosis not present

## 2017-07-26 DIAGNOSIS — I10 Essential (primary) hypertension: Secondary | ICD-10-CM | POA: Diagnosis not present

## 2017-07-26 DIAGNOSIS — Z716 Tobacco abuse counseling: Secondary | ICD-10-CM | POA: Diagnosis not present

## 2017-08-18 DIAGNOSIS — Z79899 Other long term (current) drug therapy: Secondary | ICD-10-CM | POA: Diagnosis not present

## 2017-08-20 DIAGNOSIS — I255 Ischemic cardiomyopathy: Secondary | ICD-10-CM | POA: Diagnosis not present

## 2017-09-16 DIAGNOSIS — J309 Allergic rhinitis, unspecified: Secondary | ICD-10-CM | POA: Diagnosis not present

## 2018-01-27 DIAGNOSIS — I10 Essential (primary) hypertension: Secondary | ICD-10-CM | POA: Diagnosis not present

## 2018-01-27 DIAGNOSIS — E1169 Type 2 diabetes mellitus with other specified complication: Secondary | ICD-10-CM | POA: Diagnosis not present

## 2018-01-27 DIAGNOSIS — N186 End stage renal disease: Secondary | ICD-10-CM | POA: Diagnosis not present

## 2018-01-27 DIAGNOSIS — E1122 Type 2 diabetes mellitus with diabetic chronic kidney disease: Secondary | ICD-10-CM | POA: Diagnosis not present

## 2018-01-27 DIAGNOSIS — E1165 Type 2 diabetes mellitus with hyperglycemia: Secondary | ICD-10-CM | POA: Diagnosis not present

## 2018-01-27 DIAGNOSIS — E782 Mixed hyperlipidemia: Secondary | ICD-10-CM | POA: Diagnosis not present

## 2018-06-22 IMAGING — DX DG CHEST 2V
2 series · 2 of 2 positions shown · non-contrast
Comparison: 01/31/2015

CLINICAL DATA: Cough, cold for 1 week

EXAM:
CHEST  2 VIEW

[chest pa]
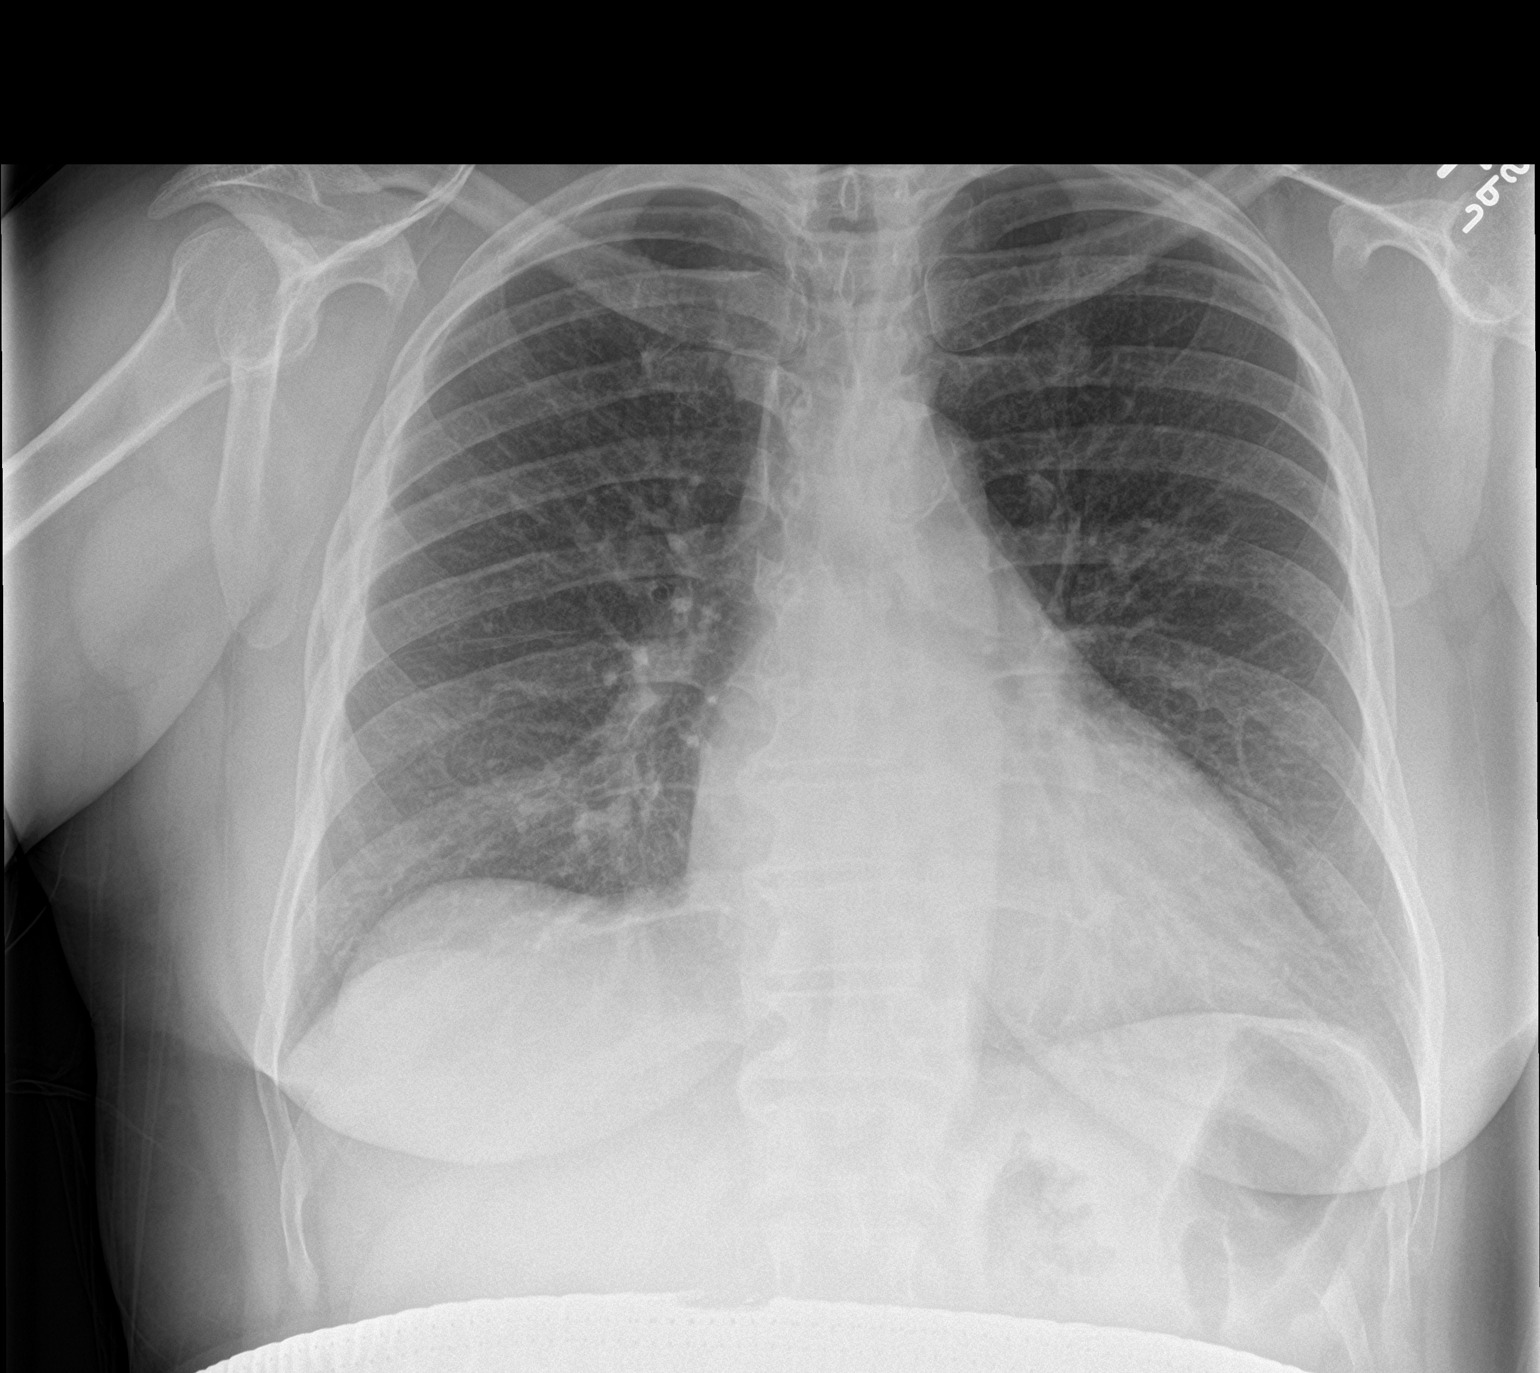

[chest lat]
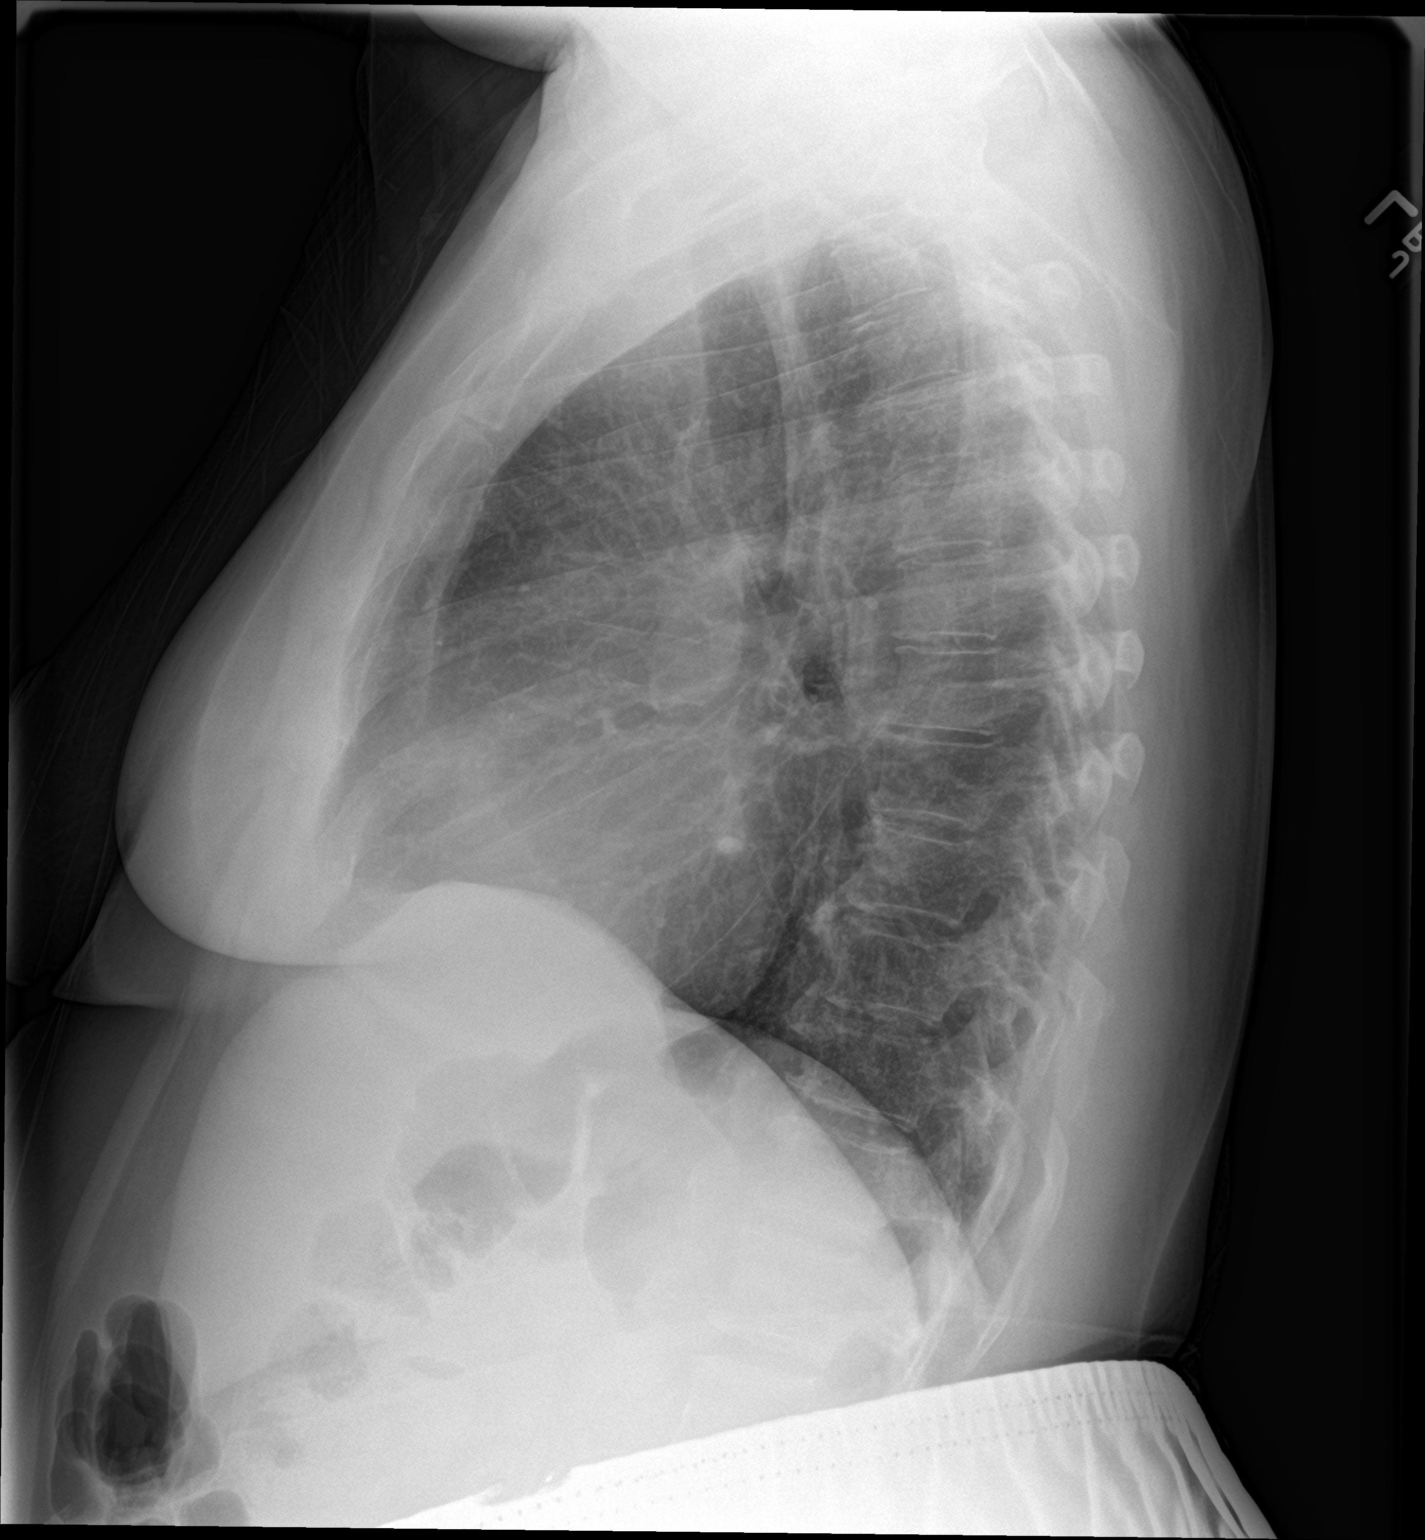

[2 of 2 positions shown; findings below may reference images not displayed]

FINDINGS: Normal mediastinum and cardiac silhouette. Normal pulmonary
vasculature. No evidence of effusion, infiltrate, or pneumothorax.
No acute bony abnormality.
IMPRESSION: No acute cardiopulmonary process.

## 2018-06-23 DIAGNOSIS — Z6838 Body mass index (BMI) 38.0-38.9, adult: Secondary | ICD-10-CM | POA: Diagnosis not present

## 2018-06-23 DIAGNOSIS — F411 Generalized anxiety disorder: Secondary | ICD-10-CM | POA: Diagnosis not present

## 2018-06-23 DIAGNOSIS — E7800 Pure hypercholesterolemia, unspecified: Secondary | ICD-10-CM | POA: Diagnosis not present

## 2018-06-23 DIAGNOSIS — E1149 Type 2 diabetes mellitus with other diabetic neurological complication: Secondary | ICD-10-CM | POA: Diagnosis not present

## 2018-06-23 DIAGNOSIS — I2510 Atherosclerotic heart disease of native coronary artery without angina pectoris: Secondary | ICD-10-CM | POA: Diagnosis not present

## 2018-06-23 DIAGNOSIS — I10 Essential (primary) hypertension: Secondary | ICD-10-CM | POA: Diagnosis not present

## 2018-06-23 DIAGNOSIS — E119 Type 2 diabetes mellitus without complications: Secondary | ICD-10-CM | POA: Diagnosis not present

## 2018-06-23 DIAGNOSIS — M19071 Primary osteoarthritis, right ankle and foot: Secondary | ICD-10-CM | POA: Diagnosis not present

## 2018-06-23 DIAGNOSIS — Z79899 Other long term (current) drug therapy: Secondary | ICD-10-CM | POA: Diagnosis not present

## 2018-06-23 DIAGNOSIS — F17210 Nicotine dependence, cigarettes, uncomplicated: Secondary | ICD-10-CM | POA: Diagnosis not present

## 2018-07-06 DIAGNOSIS — E119 Type 2 diabetes mellitus without complications: Secondary | ICD-10-CM | POA: Diagnosis not present

## 2018-07-06 DIAGNOSIS — E7800 Pure hypercholesterolemia, unspecified: Secondary | ICD-10-CM | POA: Diagnosis not present

## 2018-07-06 DIAGNOSIS — I10 Essential (primary) hypertension: Secondary | ICD-10-CM | POA: Diagnosis not present

## 2018-07-06 DIAGNOSIS — M19071 Primary osteoarthritis, right ankle and foot: Secondary | ICD-10-CM | POA: Diagnosis not present

## 2018-08-03 DIAGNOSIS — M19071 Primary osteoarthritis, right ankle and foot: Secondary | ICD-10-CM | POA: Diagnosis not present

## 2018-08-03 DIAGNOSIS — J302 Other seasonal allergic rhinitis: Secondary | ICD-10-CM | POA: Diagnosis not present

## 2018-08-03 DIAGNOSIS — J3089 Other allergic rhinitis: Secondary | ICD-10-CM | POA: Diagnosis not present

## 2018-08-03 DIAGNOSIS — E559 Vitamin D deficiency, unspecified: Secondary | ICD-10-CM | POA: Diagnosis not present

## 2018-08-04 DIAGNOSIS — J302 Other seasonal allergic rhinitis: Secondary | ICD-10-CM | POA: Diagnosis not present

## 2018-08-04 DIAGNOSIS — J3089 Other allergic rhinitis: Secondary | ICD-10-CM | POA: Diagnosis not present

## 2018-08-07 DIAGNOSIS — J3089 Other allergic rhinitis: Secondary | ICD-10-CM | POA: Diagnosis not present

## 2018-08-07 DIAGNOSIS — J302 Other seasonal allergic rhinitis: Secondary | ICD-10-CM | POA: Diagnosis not present

## 2018-08-08 DIAGNOSIS — J3089 Other allergic rhinitis: Secondary | ICD-10-CM | POA: Diagnosis not present

## 2018-08-08 DIAGNOSIS — J302 Other seasonal allergic rhinitis: Secondary | ICD-10-CM | POA: Diagnosis not present

## 2018-08-10 DIAGNOSIS — J3089 Other allergic rhinitis: Secondary | ICD-10-CM | POA: Diagnosis not present

## 2018-08-10 DIAGNOSIS — J302 Other seasonal allergic rhinitis: Secondary | ICD-10-CM | POA: Diagnosis not present

## 2018-08-11 DIAGNOSIS — J302 Other seasonal allergic rhinitis: Secondary | ICD-10-CM | POA: Diagnosis not present

## 2018-08-11 DIAGNOSIS — J3089 Other allergic rhinitis: Secondary | ICD-10-CM | POA: Diagnosis not present

## 2018-08-14 DIAGNOSIS — J3089 Other allergic rhinitis: Secondary | ICD-10-CM | POA: Diagnosis not present

## 2018-08-14 DIAGNOSIS — J302 Other seasonal allergic rhinitis: Secondary | ICD-10-CM | POA: Diagnosis not present

## 2018-08-15 DIAGNOSIS — J3089 Other allergic rhinitis: Secondary | ICD-10-CM | POA: Diagnosis not present

## 2018-08-15 DIAGNOSIS — J302 Other seasonal allergic rhinitis: Secondary | ICD-10-CM | POA: Diagnosis not present

## 2018-09-07 DIAGNOSIS — H04123 Dry eye syndrome of bilateral lacrimal glands: Secondary | ICD-10-CM | POA: Diagnosis not present

## 2018-09-07 DIAGNOSIS — E119 Type 2 diabetes mellitus without complications: Secondary | ICD-10-CM | POA: Diagnosis not present

## 2018-09-07 DIAGNOSIS — H35033 Hypertensive retinopathy, bilateral: Secondary | ICD-10-CM | POA: Diagnosis not present

## 2018-09-07 DIAGNOSIS — H5213 Myopia, bilateral: Secondary | ICD-10-CM | POA: Diagnosis not present

## 2018-12-07 DIAGNOSIS — Z03818 Encounter for observation for suspected exposure to other biological agents ruled out: Secondary | ICD-10-CM | POA: Diagnosis not present

## 2018-12-07 DIAGNOSIS — J029 Acute pharyngitis, unspecified: Secondary | ICD-10-CM | POA: Diagnosis not present

## 2018-12-07 DIAGNOSIS — R51 Headache: Secondary | ICD-10-CM | POA: Diagnosis not present

## 2018-12-09 DIAGNOSIS — R05 Cough: Secondary | ICD-10-CM | POA: Diagnosis not present

## 2018-12-09 DIAGNOSIS — Z03818 Encounter for observation for suspected exposure to other biological agents ruled out: Secondary | ICD-10-CM | POA: Diagnosis not present

## 2018-12-09 DIAGNOSIS — Z09 Encounter for follow-up examination after completed treatment for conditions other than malignant neoplasm: Secondary | ICD-10-CM | POA: Diagnosis not present

## 2018-12-11 DIAGNOSIS — Z03818 Encounter for observation for suspected exposure to other biological agents ruled out: Secondary | ICD-10-CM | POA: Diagnosis not present

## 2018-12-11 DIAGNOSIS — R05 Cough: Secondary | ICD-10-CM | POA: Diagnosis not present

## 2018-12-13 DIAGNOSIS — Z09 Encounter for follow-up examination after completed treatment for conditions other than malignant neoplasm: Secondary | ICD-10-CM | POA: Diagnosis not present

## 2019-03-23 DIAGNOSIS — Z760 Encounter for issue of repeat prescription: Secondary | ICD-10-CM | POA: Diagnosis not present

## 2019-03-23 DIAGNOSIS — Z1159 Encounter for screening for other viral diseases: Secondary | ICD-10-CM | POA: Diagnosis not present

## 2019-03-29 DIAGNOSIS — E7800 Pure hypercholesterolemia, unspecified: Secondary | ICD-10-CM | POA: Diagnosis not present

## 2019-03-29 DIAGNOSIS — F411 Generalized anxiety disorder: Secondary | ICD-10-CM | POA: Diagnosis not present

## 2019-03-29 DIAGNOSIS — I10 Essential (primary) hypertension: Secondary | ICD-10-CM | POA: Diagnosis not present

## 2019-03-29 DIAGNOSIS — E1169 Type 2 diabetes mellitus with other specified complication: Secondary | ICD-10-CM | POA: Diagnosis not present
